# Patient Record
Sex: Female | Born: 1945 | Race: White | Hispanic: No | Smoking: Former smoker
Health system: Southern US, Community
[De-identification: ages and names within clinical notes are randomized; demographics above are authoritative.]

## PROBLEM LIST (undated history)

## (undated) DIAGNOSIS — E785 Hyperlipidemia, unspecified: Secondary | ICD-10-CM

## (undated) DIAGNOSIS — K589 Irritable bowel syndrome without diarrhea: Secondary | ICD-10-CM

## (undated) DIAGNOSIS — I10 Essential (primary) hypertension: Secondary | ICD-10-CM

## (undated) DIAGNOSIS — H269 Unspecified cataract: Secondary | ICD-10-CM

## (undated) DIAGNOSIS — M1990 Unspecified osteoarthritis, unspecified site: Secondary | ICD-10-CM

## (undated) HISTORY — DX: Hyperlipidemia, unspecified: E78.5

## (undated) HISTORY — PX: CATARACT EXTRACTION, BILATERAL: SHX1313

## (undated) HISTORY — PX: COLONOSCOPY: SHX174

## (undated) HISTORY — PX: CHOLECYSTECTOMY: SHX55

## (undated) HISTORY — DX: Unspecified cataract: H26.9

## (undated) HISTORY — DX: Essential (primary) hypertension: I10

## (undated) HISTORY — DX: Irritable bowel syndrome, unspecified: K58.9

## (undated) HISTORY — DX: Unspecified osteoarthritis, unspecified site: M19.90

## (undated) HISTORY — PX: APPENDECTOMY: SHX54

---

## 1999-02-15 ENCOUNTER — Other Ambulatory Visit: Admission: RE | Admit: 1999-02-15 | Discharge: 1999-02-15 | Payer: Self-pay | Admitting: Obstetrics and Gynecology

## 2000-10-19 ENCOUNTER — Other Ambulatory Visit: Admission: RE | Admit: 2000-10-19 | Discharge: 2000-10-19 | Payer: Self-pay | Admitting: Obstetrics and Gynecology

## 2002-01-07 ENCOUNTER — Other Ambulatory Visit: Admission: RE | Admit: 2002-01-07 | Discharge: 2002-01-07 | Payer: Self-pay | Admitting: Obstetrics and Gynecology

## 2002-03-18 ENCOUNTER — Encounter (INDEPENDENT_AMBULATORY_CARE_PROVIDER_SITE_OTHER): Payer: Self-pay

## 2002-03-18 ENCOUNTER — Ambulatory Visit (HOSPITAL_COMMUNITY): Admission: RE | Admit: 2002-03-18 | Discharge: 2002-03-18 | Payer: Self-pay | Admitting: Obstetrics and Gynecology

## 2003-02-23 ENCOUNTER — Other Ambulatory Visit: Admission: RE | Admit: 2003-02-23 | Discharge: 2003-02-23 | Payer: Self-pay | Admitting: Obstetrics and Gynecology

## 2004-06-13 ENCOUNTER — Other Ambulatory Visit: Admission: RE | Admit: 2004-06-13 | Discharge: 2004-06-13 | Payer: Self-pay | Admitting: Obstetrics and Gynecology

## 2004-09-04 ENCOUNTER — Ambulatory Visit (HOSPITAL_COMMUNITY): Admission: RE | Admit: 2004-09-04 | Discharge: 2004-09-04 | Payer: Self-pay | Admitting: *Deleted

## 2005-08-15 ENCOUNTER — Other Ambulatory Visit: Admission: RE | Admit: 2005-08-15 | Discharge: 2005-08-15 | Payer: Self-pay | Admitting: Obstetrics and Gynecology

## 2005-12-06 ENCOUNTER — Emergency Department (HOSPITAL_COMMUNITY): Admission: EM | Admit: 2005-12-06 | Discharge: 2005-12-06 | Payer: Self-pay | Admitting: Emergency Medicine

## 2005-12-10 ENCOUNTER — Encounter: Admission: RE | Admit: 2005-12-10 | Discharge: 2005-12-10 | Payer: Self-pay | Admitting: Internal Medicine

## 2006-08-18 ENCOUNTER — Encounter: Admission: RE | Admit: 2006-08-18 | Discharge: 2006-08-18 | Payer: Self-pay | Admitting: Obstetrics and Gynecology

## 2008-10-17 ENCOUNTER — Encounter: Admission: RE | Admit: 2008-10-17 | Discharge: 2008-10-17 | Payer: Self-pay | Admitting: Internal Medicine

## 2011-01-20 ENCOUNTER — Other Ambulatory Visit: Payer: Self-pay | Admitting: Internal Medicine

## 2011-01-20 DIAGNOSIS — Z1231 Encounter for screening mammogram for malignant neoplasm of breast: Secondary | ICD-10-CM

## 2011-01-27 ENCOUNTER — Ambulatory Visit
Admission: RE | Admit: 2011-01-27 | Discharge: 2011-01-27 | Disposition: A | Discharge status: Home / Self Care | Payer: 59 | Source: Ambulatory Visit | Attending: Internal Medicine | Admitting: Internal Medicine

## 2011-01-27 DIAGNOSIS — Z1231 Encounter for screening mammogram for malignant neoplasm of breast: Secondary | ICD-10-CM

## 2011-05-02 NOTE — H&P (Signed)
[facility]  Patient:    Connie Gilbert, Connie Gilbert Visit Number: [id number] MRN: [id number]          Service Type: Attending:  Guy Sandifer. Arleta Creek, M.D. Dictated by:   Guy Sandifer Arleta Creek, M.D. Adm. Date:  03/18/02                           History and Physical  CHIEF COMPLAINT:              Postmenopausal bleeding.  HISTORY OF PRESENT ILLNESS:   This patient is a 65 year old white female G1, P1 who is postmenopausal.  She discontinued Prempro this past July.  She subsequently had bleeding for about one week.  On ultrasound of February 18, 2002, the uterus measured 7.47 x 4.71 x 4.81 cm.  The endometrial stripe was 10.1 mm and appeared somewhat irregular and heterogenous.  There is a probable degenerating intramural fibroid measuring 1.4 cm in greatest dimension.  Ther was a 1 cm ruptured follicle on the left ovary.  The right ovary was normal. Sonohysterogram revealed a thickened irregular endometrial border with a question of a solid nodule, possibly consistent with a polyp measuring 1.3 cm. Hysteroscopy D&C has been discussed.  The patient is being admitted for this surgery.  PAST MEDICAL HISTORY:         1. Chronic hypertension.                               2. Irritable bowel syndrome.                               3. Abnormal lipids.                               4. History of cervical dysplasia.  PAST SURGICAL HISTORY:        Cholecystectomy.  FAMILY HISTORY:               Colon cancer in paternal aunt.  Lung cancer in maternal uncle.  Chronic hypertension in mother and father.  Heart disease in father and paternal grandfather.  Parkinsons in two paternal uncles. Osteoporosis.  OBSTETRIC HISTORY:            Vaginal delivery x 1.  MEDICATIONS:                  Prinivil, atenolol, Lipitor.  ALLERGIES:                    No known drug allergies.  SOCIAL HISTORY:               The patient consumes alcohol on a social basis. Denies tobacco or drug  abuse.  REVIEW OF SYSTEMS:            Negative except as above.  PHYSICAL EXAMINATION: VITAL SIGNS:                  Height 5 feet 1-1/2 inches, weight 128 pounds, blood pressure 140/78.  HEENT:                        Without thyromegaly.  LUNGS:  Clear to auscultation.  HEART:                        Regular rate and rhythm.  BACK:                         Without CVA tenderness.  BREASTS:                      Without masses, retractions or discharge.  PELVIC:                       Vulva, vaginal and cervix without lesions. Uterus is anteverted, normal size and nontender.  Adnexa nontender without masses.  RECTAL:                       Without masses.  EXTREMITIES:                  Within normal limits.  NEUROLOGICAL:                 Within normal limits.  ASSESSMENT:                   Postmenopausal bleeding.  PLAN:                         Hysteroscopy with resectoscope, dilatation and curettage. Dictated by:   Guy Sandifer Arleta Creek, M.D. Attending:  Guy Sandifer Arleta Creek, M.D. DD:  03/14/02 TD:  03/14/02 Job: [id number]

## 2011-05-02 NOTE — Op Note (Signed)
[name], Connie Gilbert                ACCOUNT NO.:  [id number]   MEDICAL RECORD NO.:  [id number]          PATIENT TYPE:  AMB   LOCATION:  ENDO                         FACILITY:  [facility]   PHYSICIAN:  Georgiana Spinner, M.D.    DATE OF BIRTH:  1946-06-11   DATE OF PROCEDURE:  09/04/2004  DATE OF DISCHARGE:                                 OPERATIVE REPORT   PROCEDURE:  Colonoscopy.   INDICATIONS FOR PROCEDURE:  Colon cancer screening.   ANESTHESIA:  Fentanyl 62.5 mcg, Versed 6 mg.   PROCEDURE:  With the patient mildly sedated in the left lateral decubitus  position, the Olympus videoscopic colonoscope was inserted in the rectum and  passed under direct vision to the cecum identified by the ileocecal valve  and appendiceal orifice, both of which were photographed.  From this point,  the colonoscope was slowly withdrawn taking circumferential views of the  entire colonic mucosa stopping in the rectum which appeared normal on direct  and showed small hemorrhoids on retroflex view.  The endoscope was  straightened and withdrawn.  The patient's vital signs and pulse oximeter  remained stable.  The patient tolerated the procedure well without apparent  complications.   FINDINGS:  Internal hemorrhoids, small, otherwise, unremarkable examination.   PLAN:  Follow up with me in five years or as needed.      GMO/MEDQ  D:  09/04/2004  T:  09/04/2004  Job:  [id number]

## 2011-05-02 NOTE — Op Note (Signed)
[facility]  Patient:    Connie Gilbert, Connie Gilbert Visit Number: [id number] MRN: [id number]          Service Type: DSU Location: [facility] Attending Physician:  Soledad Gerlach Dictated by:   Guy Sandifer Arleta Creek, M.D. Proc. Date: 03/18/02 Admit Date:  03/18/2002                             Operative Report  PREOPERATIVE DIAGNOSIS:       Postmenopausal bleeding.  POSTOPERATIVE DIAGNOSIS:      Endometrial mass.  OPERATION/PROCEDURE:          1. Hysteroscopy with resectoscope.                               2. Dilatation and curettage.  SURGEON                       Guy Sandifer. Arleta Creek, M.D.  ANESTHESIA:                   General with LMA.  ESTIMATED BLOOD LOSS:         Less than 50 cc.  INTAKE AND OUTPUT:            Sorbitol distending media, 90 cc deficit.  INDICATIONS/CONSENT:          The patient is a 65 year old white female, G1 P1, with postmenopausal bleeding.  Ultrasound with sonohysterogram was suggestive of an endometrial mass.  Hysteroscopy with resectoscope and D&C was discussed with the patient.  Potential risks and complications were discussed including, but not limited to, infection, bowel/bladder/ureteral damage, bleeding requiring transfusion of blood products with possible transfusion reaction, HIV, and hepatitis acquisition, DVT, PE, pneumonia, uterine perforation and hysteroscopy.  All questions were answered and consent is signed and on the chart.  FINDINGS:                     Fallopian tube ostia are normal.  No abnormal blood vessels.  In the mid posterior endometrial canal is an approximate 1 cm submucosal mass.  DESCRIPTION OF PROCEDURE:     The patient was taken to the operating room and placed in the dorsal supine position, with general anesthesia induced via LMA. She was then placed in the dorsal lithotomy position, where she was prepped, the bladder straight catheterized, and draped in sterile fashion.  A  bivalve speculum was placed in the vagina and the anterior cervical lip was injected with 1% Xylocaine and grasped with a single-tooth tenaculum.  Paracervical block was then placed at the 2, 4, 5, 7, 8, and 10 oclock positions with approximately 20 cc total of 1% Xylocaine plain.  The cervix was then gently progressively dilated to a 29 Pratt dilator.  A diagnostic hysteroscope was then introduced and advanced under direct visualization using Sorbitol distending medium.  The above findings were noted.  The diagnostic hysteroscope was withdrawn and the cervix was dilated to a 35 Pratt dilator. The resectoscope was then placed and advanced under direct visualization.  The above noted mass was resected with a single right angle wire loop without difficulty.  Good hemostasis was noted.  The hysteroscope was withdrawn and sharp curettage was carried out for very scant, if any, tissue.  The tenaculum was removed and good hemostasis was noted.  All counts were correct and  the patient was awakened and taken to the recovery room in stable condition. Dictated by:   Guy Sandifer Arleta Creek, M.D. Attending Physician:  Soledad Gerlach DD:  03/18/02 TD:  03/19/02 Job: [id number]

## 2011-06-05 ENCOUNTER — Other Ambulatory Visit: Payer: Self-pay | Admitting: Internal Medicine

## 2011-06-05 ENCOUNTER — Other Ambulatory Visit (HOSPITAL_COMMUNITY)
Admission: RE | Admit: 2011-06-05 | Discharge: 2011-06-05 | Disposition: A | Discharge status: Home / Self Care | Payer: 59 | Source: Ambulatory Visit | Attending: Internal Medicine | Admitting: Internal Medicine

## 2011-06-05 DIAGNOSIS — Z01419 Encounter for gynecological examination (general) (routine) without abnormal findings: Secondary | ICD-10-CM | POA: Insufficient documentation

## 2011-06-05 DIAGNOSIS — Z1159 Encounter for screening for other viral diseases: Secondary | ICD-10-CM | POA: Insufficient documentation

## 2012-07-21 DIAGNOSIS — E7800 Pure hypercholesterolemia, unspecified: Secondary | ICD-10-CM | POA: Diagnosis not present

## 2012-07-26 DIAGNOSIS — Z23 Encounter for immunization: Secondary | ICD-10-CM | POA: Diagnosis not present

## 2012-07-26 DIAGNOSIS — I10 Essential (primary) hypertension: Secondary | ICD-10-CM | POA: Diagnosis not present

## 2012-07-26 DIAGNOSIS — G43809 Other migraine, not intractable, without status migrainosus: Secondary | ICD-10-CM | POA: Diagnosis not present

## 2012-07-26 DIAGNOSIS — E7800 Pure hypercholesterolemia, unspecified: Secondary | ICD-10-CM | POA: Diagnosis not present

## 2012-09-01 DIAGNOSIS — L570 Actinic keratosis: Secondary | ICD-10-CM | POA: Diagnosis not present

## 2012-09-01 DIAGNOSIS — D235 Other benign neoplasm of skin of trunk: Secondary | ICD-10-CM | POA: Diagnosis not present

## 2012-10-22 DIAGNOSIS — L821 Other seborrheic keratosis: Secondary | ICD-10-CM | POA: Diagnosis not present

## 2012-10-22 DIAGNOSIS — L819 Disorder of pigmentation, unspecified: Secondary | ICD-10-CM | POA: Diagnosis not present

## 2012-10-22 DIAGNOSIS — Z419 Encounter for procedure for purposes other than remedying health state, unspecified: Secondary | ICD-10-CM | POA: Diagnosis not present

## 2013-01-20 DIAGNOSIS — I10 Essential (primary) hypertension: Secondary | ICD-10-CM | POA: Diagnosis not present

## 2013-01-20 DIAGNOSIS — E7800 Pure hypercholesterolemia, unspecified: Secondary | ICD-10-CM | POA: Diagnosis not present

## 2013-01-26 DIAGNOSIS — I10 Essential (primary) hypertension: Secondary | ICD-10-CM | POA: Diagnosis not present

## 2013-01-26 DIAGNOSIS — E7800 Pure hypercholesterolemia, unspecified: Secondary | ICD-10-CM | POA: Diagnosis not present

## 2013-01-26 DIAGNOSIS — Z7982 Long term (current) use of aspirin: Secondary | ICD-10-CM | POA: Diagnosis not present

## 2013-01-26 DIAGNOSIS — R079 Chest pain, unspecified: Secondary | ICD-10-CM | POA: Diagnosis not present

## 2013-04-12 ENCOUNTER — Other Ambulatory Visit: Payer: Self-pay

## 2013-04-12 DIAGNOSIS — Z1231 Encounter for screening mammogram for malignant neoplasm of breast: Secondary | ICD-10-CM

## 2013-04-19 ENCOUNTER — Ambulatory Visit: Payer: 59

## 2013-05-03 ENCOUNTER — Ambulatory Visit
Admission: RE | Admit: 2013-05-03 | Discharge: 2013-05-03 | Disposition: A | Discharge status: Home / Self Care | Payer: Medicare Other | Source: Ambulatory Visit

## 2013-05-03 DIAGNOSIS — Z1231 Encounter for screening mammogram for malignant neoplasm of breast: Secondary | ICD-10-CM | POA: Diagnosis not present

## 2013-07-11 ENCOUNTER — Telehealth: Payer: Self-pay

## 2013-07-11 ENCOUNTER — Ambulatory Visit (INDEPENDENT_AMBULATORY_CARE_PROVIDER_SITE_OTHER): Payer: Medicare Other | Admitting: Physician Assistant

## 2013-07-11 VITALS — BP 120/80 | HR 73 | Temp 98.0°F | Resp 16 | Ht 67.5 in | Wt 117.0 lb

## 2013-07-11 DIAGNOSIS — Z130 Encounter for screening for diseases of the blood and blood-forming organs and certain disorders involving the immune mechanism: Secondary | ICD-10-CM | POA: Diagnosis not present

## 2013-07-11 LAB — POCT CBC
Granulocyte percent: 72.8 %G (ref 37–80)
HCT, POC: 44.8 % (ref 37.7–47.9)
Hemoglobin: 14.8 g/dL (ref 12.2–16.2)
Lymph, poc: 1.3 (ref 0.6–3.4)
MCH, POC: 32.7 pg — AB (ref 27–31.2)
MCHC: 33 g/dL (ref 31.8–35.4)
MCV: 98.9 fL — AB (ref 80–97)
MID (cbc): 0.4 (ref 0–0.9)
MPV: 9.2 fL (ref 0–99.8)
POC Granulocyte: 4.5 (ref 2–6.9)
POC LYMPH PERCENT: 21.5 %L (ref 10–50)
POC MID %: 5.7 %M (ref 0–12)
Platelet Count, POC: 182 10*3/uL (ref 142–424)
RBC: 4.53 M/uL (ref 4.04–5.48)
RDW, POC: 12.6 %
WBC: 6.2 10*3/uL (ref 4.6–10.2)

## 2013-07-11 NOTE — Progress Notes (Signed)
  Subjective:    Patient ID: Connie Gilbert, female    DOB: May 28, 1946, 67 y.o.   MRN: [id number]  HPI This 67 y.o. female presents for CBC requested by Dr. Benna Dunks, in preparation for upcoming eyelid lift.   Review of Systems     Objective:   Physical Exam BP 120/80  Pulse 73  Temp(Src) 98 F (36.7 C) (Oral)  Resp 16  Ht 5' 7.5" (1.715 m)  Wt 117 lb (53.071 kg)  BMI 18.04 kg/m2  SpO2 99% WDWNWF, A&O x 3.  Results for orders placed in visit on 07/11/13  POCT CBC      Result Value Range   WBC 6.2  4.6 - 10.2 K/uL   Lymph, poc 1.3  0.6 - 3.4   POC LYMPH PERCENT 21.5  10 - 50 %L   MID (cbc) 0.4  0 - 0.9   POC MID % 5.7  0 - 12 %M   POC Granulocyte 4.5  2 - 6.9   Granulocyte percent 72.8  37 - 80 %G   RBC 4.53  4.04 - 5.48 M/uL   Hemoglobin 14.8  12.2 - 16.2 g/dL   HCT, POC 40.9  81.1 - 47.9 %   MCV 98.9 (*) 80 - 97 fL   MCH, POC 32.7 (*) 27 - 31.2 pg   MCHC 33.0  31.8 - 35.4 g/dL   RDW, POC 91.4     Platelet Count, POC 182  142 - 424 K/uL   MPV 9.2  0 - 99.8 fL        Assessment & Plan:  Screening for iron deficiency anemia - Plan: POCT CBC  Fernande Bras, PA-C Physician Assistant-Certified [facility]

## 2013-07-11 NOTE — Telephone Encounter (Signed)
Patients states that when she was here earlier that she knew that insurance probably want going to pay for CBC so that she will pay for it

## 2013-07-11 NOTE — Telephone Encounter (Signed)
Pt came back in the office and was stating that she was suppose to be signing some type of insurance form, she had received a call to come back and do so. I could not find any documentation in epic from the phone call and could not find chelle to ask about this form the patient was talking about Illene Silver tried to help with finding anything that was suppose to be signed but neither of [place] was able to find anything Call back number is [phone]

## 2013-07-11 NOTE — Telephone Encounter (Signed)
Please take care of this. Thanks  

## 2013-08-01 DIAGNOSIS — I10 Essential (primary) hypertension: Secondary | ICD-10-CM | POA: Diagnosis not present

## 2013-08-01 DIAGNOSIS — Z0000 Encounter for general adult medical examination without abnormal findings: Secondary | ICD-10-CM | POA: Diagnosis not present

## 2013-08-01 DIAGNOSIS — E7800 Pure hypercholesterolemia, unspecified: Secondary | ICD-10-CM | POA: Diagnosis not present

## 2013-08-11 DIAGNOSIS — Z1212 Encounter for screening for malignant neoplasm of rectum: Secondary | ICD-10-CM | POA: Diagnosis not present

## 2013-08-11 DIAGNOSIS — Z7982 Long term (current) use of aspirin: Secondary | ICD-10-CM | POA: Diagnosis not present

## 2013-08-11 DIAGNOSIS — E7800 Pure hypercholesterolemia, unspecified: Secondary | ICD-10-CM | POA: Diagnosis not present

## 2013-08-11 DIAGNOSIS — I10 Essential (primary) hypertension: Secondary | ICD-10-CM | POA: Diagnosis not present

## 2013-08-11 DIAGNOSIS — R079 Chest pain, unspecified: Secondary | ICD-10-CM | POA: Diagnosis not present

## 2013-09-02 DIAGNOSIS — L821 Other seborrheic keratosis: Secondary | ICD-10-CM | POA: Diagnosis not present

## 2013-09-02 DIAGNOSIS — D235 Other benign neoplasm of skin of trunk: Secondary | ICD-10-CM | POA: Diagnosis not present

## 2013-09-06 DIAGNOSIS — M899 Disorder of bone, unspecified: Secondary | ICD-10-CM | POA: Diagnosis not present

## 2013-10-21 DIAGNOSIS — M810 Age-related osteoporosis without current pathological fracture: Secondary | ICD-10-CM | POA: Diagnosis not present

## 2013-10-27 DIAGNOSIS — I10 Essential (primary) hypertension: Secondary | ICD-10-CM | POA: Diagnosis not present

## 2013-10-27 DIAGNOSIS — E7800 Pure hypercholesterolemia, unspecified: Secondary | ICD-10-CM | POA: Diagnosis not present

## 2013-10-27 DIAGNOSIS — M810 Age-related osteoporosis without current pathological fracture: Secondary | ICD-10-CM | POA: Diagnosis not present

## 2014-04-17 DIAGNOSIS — J329 Chronic sinusitis, unspecified: Secondary | ICD-10-CM | POA: Diagnosis not present

## 2014-04-28 DIAGNOSIS — Z961 Presence of intraocular lens: Secondary | ICD-10-CM | POA: Diagnosis not present

## 2014-04-28 DIAGNOSIS — H179 Unspecified corneal scar and opacity: Secondary | ICD-10-CM | POA: Diagnosis not present

## 2014-08-25 DIAGNOSIS — Z23 Encounter for immunization: Secondary | ICD-10-CM | POA: Diagnosis not present

## 2014-08-25 DIAGNOSIS — I10 Essential (primary) hypertension: Secondary | ICD-10-CM | POA: Diagnosis not present

## 2014-08-25 DIAGNOSIS — Z0000 Encounter for general adult medical examination without abnormal findings: Secondary | ICD-10-CM | POA: Diagnosis not present

## 2014-08-25 DIAGNOSIS — M810 Age-related osteoporosis without current pathological fracture: Secondary | ICD-10-CM | POA: Diagnosis not present

## 2014-08-25 DIAGNOSIS — E7800 Pure hypercholesterolemia, unspecified: Secondary | ICD-10-CM | POA: Diagnosis not present

## 2014-09-05 DIAGNOSIS — L821 Other seborrheic keratosis: Secondary | ICD-10-CM | POA: Diagnosis not present

## 2014-09-05 DIAGNOSIS — L820 Inflamed seborrheic keratosis: Secondary | ICD-10-CM | POA: Diagnosis not present

## 2014-09-05 DIAGNOSIS — D485 Neoplasm of uncertain behavior of skin: Secondary | ICD-10-CM | POA: Diagnosis not present

## 2014-09-05 DIAGNOSIS — L819 Disorder of pigmentation, unspecified: Secondary | ICD-10-CM | POA: Diagnosis not present

## 2014-09-05 DIAGNOSIS — D235 Other benign neoplasm of skin of trunk: Secondary | ICD-10-CM | POA: Diagnosis not present

## 2014-09-07 ENCOUNTER — Other Ambulatory Visit: Payer: Self-pay | Admitting: Internal Medicine

## 2014-09-07 ENCOUNTER — Other Ambulatory Visit (HOSPITAL_COMMUNITY)
Admission: RE | Admit: 2014-09-07 | Discharge: 2014-09-07 | Disposition: A | Discharge status: Home / Self Care | Payer: Medicare Other | Source: Ambulatory Visit | Attending: Internal Medicine | Admitting: Internal Medicine

## 2014-09-07 DIAGNOSIS — Z124 Encounter for screening for malignant neoplasm of cervix: Secondary | ICD-10-CM | POA: Insufficient documentation

## 2014-09-07 DIAGNOSIS — Z9089 Acquired absence of other organs: Secondary | ICD-10-CM | POA: Diagnosis not present

## 2014-09-07 DIAGNOSIS — Z1151 Encounter for screening for human papillomavirus (HPV): Secondary | ICD-10-CM | POA: Insufficient documentation

## 2014-09-07 DIAGNOSIS — I10 Essential (primary) hypertension: Secondary | ICD-10-CM | POA: Diagnosis not present

## 2014-09-07 DIAGNOSIS — M810 Age-related osteoporosis without current pathological fracture: Secondary | ICD-10-CM | POA: Diagnosis not present

## 2014-09-07 DIAGNOSIS — E7800 Pure hypercholesterolemia, unspecified: Secondary | ICD-10-CM | POA: Diagnosis not present

## 2014-09-07 DIAGNOSIS — Z9889 Other specified postprocedural states: Secondary | ICD-10-CM | POA: Diagnosis not present

## 2014-09-12 LAB — CYTOLOGY - PAP

## 2014-10-19 ENCOUNTER — Encounter: Payer: Self-pay | Admitting: Internal Medicine

## 2014-12-11 ENCOUNTER — Ambulatory Visit (AMBULATORY_SURGERY_CENTER): Payer: Self-pay | Admitting: *Deleted

## 2014-12-11 VITALS — Ht 61.0 in | Wt 121.6 lb

## 2014-12-11 DIAGNOSIS — Z1211 Encounter for screening for malignant neoplasm of colon: Secondary | ICD-10-CM

## 2014-12-11 NOTE — Progress Notes (Signed)
No egg or soy allergy. ewm  No issues with past sedation. ewm  No diet pills. No blood thinners. ewm  No home 02 use, no cpap. ewm  Pt declined emmi video. ewm

## 2014-12-28 ENCOUNTER — Encounter: Payer: Self-pay | Admitting: Internal Medicine

## 2014-12-28 ENCOUNTER — Ambulatory Visit (AMBULATORY_SURGERY_CENTER): Payer: Medicare Other | Admitting: Internal Medicine

## 2014-12-28 VITALS — BP 151/85 | HR 70 | Temp 96.3°F | Resp 20 | Ht 61.0 in | Wt 121.0 lb

## 2014-12-28 DIAGNOSIS — Z1211 Encounter for screening for malignant neoplasm of colon: Secondary | ICD-10-CM | POA: Diagnosis not present

## 2014-12-28 DIAGNOSIS — I10 Essential (primary) hypertension: Secondary | ICD-10-CM | POA: Diagnosis not present

## 2014-12-28 MED ORDER — SODIUM CHLORIDE 0.9 % IV SOLN: 500.0000 mL | INTRAVENOUS | Status: DC

## 2014-12-28 NOTE — Progress Notes (Signed)
A/ox3 pleased with MAC, report to Karen RN 

## 2014-12-28 NOTE — Patient Instructions (Addendum)
Colonoscopy was normal! No polyps or cancer seen.  You may consider repeating a colonoscopy in 10 years but at 89 it may not be necessary or worth it to pursue. You can sort that out with your primary care doctor.  I appreciate the opportunity to care for you. Gatha Mayer, MD, FACG  YOU HAD AN ENDOSCOPIC PROCEDURE TODAY AT [facility]: Refer to the procedure report that was given to you for any specific questions about what was found during the examination.  If the procedure report does not answer your questions, please call your gastroenterologist to clarify.  If you requested that your care partner not be given the details of your procedure findings, then the procedure report has been included in a sealed envelope for you to review at your convenience later.  YOU SHOULD EXPECT: Some feelings of bloating in the abdomen. Passage of more gas than usual.  Walking can help get rid of the air that was put into your GI tract during the procedure and reduce the bloating. If you had a lower endoscopy (such as a colonoscopy or flexible sigmoidoscopy) you may notice spotting of blood in your stool or on the toilet paper. If you underwent a bowel prep for your procedure, then you may not have a normal bowel movement for a few days.  DIET: Your first meal following the procedure should be a light meal and then it is ok to progress to your normal diet.  A half-sandwich or bowl of soup is an example of a good first meal.  Heavy or fried foods are harder to digest and may make you feel nauseous or bloated.  Likewise meals heavy in dairy and vegetables can cause extra gas to form and this can also increase the bloating.  Drink plenty of fluids but you should avoid alcoholic beverages for 24 hours.  ACTIVITY: Your care partner should take you home directly after the procedure.  You should plan to take it easy, moving slowly for the rest of the day.  You can resume normal activity the day  after the procedure however you should NOT DRIVE or use heavy machinery for 24 hours (because of the sedation medicines used during the test).    SYMPTOMS TO REPORT IMMEDIATELY: A gastroenterologist can be reached at any hour.  During normal business hours, 8:30 AM to 5:00 PM Monday through Friday, call [phone].  After hours and on weekends, please call the GI answering service at [phone] who will take a message and have the physician on call contact you.   Following lower endoscopy (colonoscopy or flexible sigmoidoscopy):  Excessive amounts of blood in the stool  Significant tenderness or worsening of abdominal pains  Swelling of the abdomen that is new, acute  Fever of 100F or higher   FOLLOW UP: If any biopsies were taken you will be contacted by phone or by letter within the next 1-3 weeks.  Call your gastroenterologist if you have not heard about the biopsies in 3 weeks.  Our staff will call the home number listed on your records the next business day following your procedure to check on you and address any questions or concerns that you may have at that time regarding the information given to you following your procedure. This is a courtesy call and so if there is no answer at the home number and we have not heard from you through the emergency physician on call, we will assume that you have  returned to your regular daily activities without incident.  SIGNATURES/CONFIDENTIALITY: You and/or your care partner have signed paperwork which will be entered into your electronic medical record.  These signatures attest to the fact that that the information above on your After Visit Summary has been reviewed and is understood.  Full responsibility of the confidentiality of this discharge information lies with you and/or your care-partner.

## 2014-12-28 NOTE — Op Note (Signed)
[place]  [address] [place], [id number]   COLONOSCOPY PROCEDURE REPORT  PATIENT: Connie Gilbert, Connie Gilbert  MR#: [id number] BIRTHDATE: 28-Mar-1946 , 75  yrs. old GENDER: female ENDOSCOPIST: Gatha Mayer, MD, [facility] PROCEDURE DATE:  12/28/2014 PROCEDURE:   Colonoscopy, screening First Screening Colonoscopy - Avg.  risk and is 50 yrs.  old or older - No.  Prior Negative Screening - Now for repeat screening. 10 or more years since last screening  History of Adenoma - Now for follow-up colonoscopy & has been > or = to 3 yrs.  N/A  Polyps Removed Today? No.  Polyps Removed Today? No.  Recommend repeat exam, <10 yrs? Polyps Removed Today? No.  Recommend repeat exam, <10 yrs? No. ASA CLASS:   Class II INDICATIONS:average risk for colorectal cancer. MEDICATIONS: Propofol 160 mg IV and Monitored anesthesia care  DESCRIPTION OF PROCEDURE:   After the risks benefits and alternatives of the procedure were thoroughly explained, informed consent was obtained.  The digital rectal exam revealed no abnormalities of the rectum.   The [id number]  endoscope was introduced through the anus and advanced to the cecum, which was identified by both the appendix and ileocecal valve. No adverse events experienced.   The quality of the prep was excellent, using MiraLax  The instrument was then slowly withdrawn as the colon was fully examined.      COLON FINDINGS: A normal appearing cecum, ileocecal valve, and appendiceal orifice were identified.  The ascending, transverse, descending, sigmoid colon, and rectum appeared unremarkable. Retroflexed views revealed no abnormalities. The time to cecum=1 minutes 55 seconds.  Withdrawal time=9 minutes 58 seconds.  The scope was withdrawn and the procedure completed. COMPLICATIONS: There were no immediate complications.  ENDOSCOPIC IMPRESSION: Normal colonoscopy - excellent prep  RECOMMENDATIONS: Consider repeat screening in 10 yrs at  age 20 - decide with PCP - no recall  eSigned:  Gatha Mayer, MD, [facility] 12/28/2014 10:03 AM   cc: The Patient and Lynnell Chad. Shelia Media, MD

## 2014-12-29 ENCOUNTER — Telehealth: Payer: Self-pay

## 2014-12-29 NOTE — Telephone Encounter (Signed)
  Follow up Call-  Call back number 12/28/2014  Post procedure Call Back phone  # [phone] hm  Permission to leave phone message Yes     Patient questions:  Do you have a fever, pain , or abdominal swelling? No. Pain Score  0 *  Have you tolerated food without any problems? Yes.    Have you been able to return to your normal activities? Yes.    Do you have any questions about your discharge instructions: Diet   No. Medications  No. Follow up visit  No.  Do you have questions or concerns about your Care? No.  Actions: * If pain score is 4 or above: No action needed, pain <4.

## 2015-04-30 DIAGNOSIS — Z961 Presence of intraocular lens: Secondary | ICD-10-CM | POA: Diagnosis not present

## 2015-04-30 DIAGNOSIS — H1789 Other corneal scars and opacities: Secondary | ICD-10-CM | POA: Diagnosis not present

## 2015-09-11 DIAGNOSIS — L814 Other melanin hyperpigmentation: Secondary | ICD-10-CM | POA: Diagnosis not present

## 2015-09-11 DIAGNOSIS — L820 Inflamed seborrheic keratosis: Secondary | ICD-10-CM | POA: Diagnosis not present

## 2015-09-11 DIAGNOSIS — D225 Melanocytic nevi of trunk: Secondary | ICD-10-CM | POA: Diagnosis not present

## 2015-09-18 DIAGNOSIS — M810 Age-related osteoporosis without current pathological fracture: Secondary | ICD-10-CM | POA: Diagnosis not present

## 2015-09-18 DIAGNOSIS — I10 Essential (primary) hypertension: Secondary | ICD-10-CM | POA: Diagnosis not present

## 2015-09-18 DIAGNOSIS — E7800 Pure hypercholesterolemia, unspecified: Secondary | ICD-10-CM | POA: Diagnosis not present

## 2015-09-24 DIAGNOSIS — T508X5A Adverse effect of diagnostic agents, initial encounter: Secondary | ICD-10-CM | POA: Diagnosis not present

## 2015-09-24 DIAGNOSIS — Z1212 Encounter for screening for malignant neoplasm of rectum: Secondary | ICD-10-CM | POA: Diagnosis not present

## 2015-09-24 DIAGNOSIS — E7800 Pure hypercholesterolemia, unspecified: Secondary | ICD-10-CM | POA: Diagnosis not present

## 2015-09-24 DIAGNOSIS — K649 Unspecified hemorrhoids: Secondary | ICD-10-CM | POA: Diagnosis not present

## 2015-09-24 DIAGNOSIS — Z9049 Acquired absence of other specified parts of digestive tract: Secondary | ICD-10-CM | POA: Diagnosis not present

## 2015-10-08 DIAGNOSIS — M810 Age-related osteoporosis without current pathological fracture: Secondary | ICD-10-CM | POA: Diagnosis not present

## 2015-10-08 DIAGNOSIS — I10 Essential (primary) hypertension: Secondary | ICD-10-CM | POA: Diagnosis not present

## 2015-10-22 DIAGNOSIS — M25551 Pain in right hip: Secondary | ICD-10-CM | POA: Diagnosis not present

## 2015-11-14 DIAGNOSIS — I10 Essential (primary) hypertension: Secondary | ICD-10-CM | POA: Diagnosis not present

## 2016-03-31 ENCOUNTER — Other Ambulatory Visit: Payer: Self-pay

## 2016-03-31 DIAGNOSIS — Z1231 Encounter for screening mammogram for malignant neoplasm of breast: Secondary | ICD-10-CM

## 2016-04-11 ENCOUNTER — Ambulatory Visit
Admission: RE | Admit: 2016-04-11 | Discharge: 2016-04-11 | Disposition: A | Discharge status: Home / Self Care | Payer: Medicare Other | Source: Ambulatory Visit

## 2016-04-11 DIAGNOSIS — Z1231 Encounter for screening mammogram for malignant neoplasm of breast: Secondary | ICD-10-CM | POA: Diagnosis not present

## 2016-04-28 DIAGNOSIS — Z961 Presence of intraocular lens: Secondary | ICD-10-CM | POA: Diagnosis not present

## 2016-04-28 DIAGNOSIS — H1789 Other corneal scars and opacities: Secondary | ICD-10-CM | POA: Diagnosis not present

## 2016-08-16 DIAGNOSIS — H60391 Other infective otitis externa, right ear: Secondary | ICD-10-CM | POA: Diagnosis not present

## 2016-09-15 DIAGNOSIS — L814 Other melanin hyperpigmentation: Secondary | ICD-10-CM | POA: Diagnosis not present

## 2016-09-15 DIAGNOSIS — D225 Melanocytic nevi of trunk: Secondary | ICD-10-CM | POA: Diagnosis not present

## 2016-09-15 DIAGNOSIS — L821 Other seborrheic keratosis: Secondary | ICD-10-CM | POA: Diagnosis not present

## 2016-09-15 DIAGNOSIS — L820 Inflamed seborrheic keratosis: Secondary | ICD-10-CM | POA: Diagnosis not present

## 2016-09-23 DIAGNOSIS — Z0000 Encounter for general adult medical examination without abnormal findings: Secondary | ICD-10-CM | POA: Diagnosis not present

## 2016-09-23 DIAGNOSIS — K649 Unspecified hemorrhoids: Secondary | ICD-10-CM | POA: Diagnosis not present

## 2016-09-23 DIAGNOSIS — E559 Vitamin D deficiency, unspecified: Secondary | ICD-10-CM | POA: Diagnosis not present

## 2016-09-23 DIAGNOSIS — Z23 Encounter for immunization: Secondary | ICD-10-CM | POA: Diagnosis not present

## 2016-09-23 DIAGNOSIS — Z7982 Long term (current) use of aspirin: Secondary | ICD-10-CM | POA: Diagnosis not present

## 2016-09-23 DIAGNOSIS — I10 Essential (primary) hypertension: Secondary | ICD-10-CM | POA: Diagnosis not present

## 2016-09-29 DIAGNOSIS — D72819 Decreased white blood cell count, unspecified: Secondary | ICD-10-CM | POA: Diagnosis not present

## 2016-09-29 DIAGNOSIS — G43109 Migraine with aura, not intractable, without status migrainosus: Secondary | ICD-10-CM | POA: Diagnosis not present

## 2016-09-29 DIAGNOSIS — Z7982 Long term (current) use of aspirin: Secondary | ICD-10-CM | POA: Diagnosis not present

## 2016-09-29 DIAGNOSIS — I10 Essential (primary) hypertension: Secondary | ICD-10-CM | POA: Diagnosis not present

## 2016-09-29 DIAGNOSIS — Z1212 Encounter for screening for malignant neoplasm of rectum: Secondary | ICD-10-CM | POA: Diagnosis not present

## 2016-10-29 DIAGNOSIS — H838X1 Other specified diseases of right inner ear: Secondary | ICD-10-CM | POA: Diagnosis not present

## 2016-10-29 DIAGNOSIS — H608X1 Other otitis externa, right ear: Secondary | ICD-10-CM | POA: Diagnosis not present

## 2016-10-29 DIAGNOSIS — H9311 Tinnitus, right ear: Secondary | ICD-10-CM | POA: Diagnosis not present

## 2017-02-23 DIAGNOSIS — J329 Chronic sinusitis, unspecified: Secondary | ICD-10-CM | POA: Diagnosis not present

## 2017-04-27 DIAGNOSIS — H1789 Other corneal scars and opacities: Secondary | ICD-10-CM | POA: Diagnosis not present

## 2017-04-27 DIAGNOSIS — Z961 Presence of intraocular lens: Secondary | ICD-10-CM | POA: Diagnosis not present

## 2017-06-16 DIAGNOSIS — R197 Diarrhea, unspecified: Secondary | ICD-10-CM | POA: Diagnosis not present

## 2017-06-18 DIAGNOSIS — R197 Diarrhea, unspecified: Secondary | ICD-10-CM | POA: Diagnosis not present

## 2017-09-17 DIAGNOSIS — M810 Age-related osteoporosis without current pathological fracture: Secondary | ICD-10-CM | POA: Diagnosis not present

## 2017-09-23 DIAGNOSIS — L820 Inflamed seborrheic keratosis: Secondary | ICD-10-CM | POA: Diagnosis not present

## 2017-09-23 DIAGNOSIS — L218 Other seborrheic dermatitis: Secondary | ICD-10-CM | POA: Diagnosis not present

## 2017-09-23 DIAGNOSIS — D225 Melanocytic nevi of trunk: Secondary | ICD-10-CM | POA: Diagnosis not present

## 2017-09-23 DIAGNOSIS — L821 Other seborrheic keratosis: Secondary | ICD-10-CM | POA: Diagnosis not present

## 2017-10-06 DIAGNOSIS — M810 Age-related osteoporosis without current pathological fracture: Secondary | ICD-10-CM | POA: Diagnosis not present

## 2017-10-06 DIAGNOSIS — Z0000 Encounter for general adult medical examination without abnormal findings: Secondary | ICD-10-CM | POA: Diagnosis not present

## 2017-10-06 DIAGNOSIS — Z7982 Long term (current) use of aspirin: Secondary | ICD-10-CM | POA: Diagnosis not present

## 2017-10-06 DIAGNOSIS — E7800 Pure hypercholesterolemia, unspecified: Secondary | ICD-10-CM | POA: Diagnosis not present

## 2017-10-06 DIAGNOSIS — Z23 Encounter for immunization: Secondary | ICD-10-CM | POA: Diagnosis not present

## 2017-10-06 DIAGNOSIS — I10 Essential (primary) hypertension: Secondary | ICD-10-CM | POA: Diagnosis not present

## 2017-10-08 DIAGNOSIS — G43109 Migraine with aura, not intractable, without status migrainosus: Secondary | ICD-10-CM | POA: Diagnosis not present

## 2017-10-08 DIAGNOSIS — M810 Age-related osteoporosis without current pathological fracture: Secondary | ICD-10-CM | POA: Diagnosis not present

## 2017-10-08 DIAGNOSIS — Z7982 Long term (current) use of aspirin: Secondary | ICD-10-CM | POA: Diagnosis not present

## 2017-10-08 DIAGNOSIS — K589 Irritable bowel syndrome without diarrhea: Secondary | ICD-10-CM | POA: Diagnosis not present

## 2017-10-08 DIAGNOSIS — Z8659 Personal history of other mental and behavioral disorders: Secondary | ICD-10-CM | POA: Diagnosis not present

## 2017-10-08 DIAGNOSIS — T508X5A Adverse effect of diagnostic agents, initial encounter: Secondary | ICD-10-CM | POA: Diagnosis not present

## 2017-10-08 DIAGNOSIS — I10 Essential (primary) hypertension: Secondary | ICD-10-CM | POA: Diagnosis not present

## 2017-10-08 DIAGNOSIS — K649 Unspecified hemorrhoids: Secondary | ICD-10-CM | POA: Diagnosis not present

## 2017-10-08 DIAGNOSIS — E7800 Pure hypercholesterolemia, unspecified: Secondary | ICD-10-CM | POA: Diagnosis not present

## 2017-10-08 DIAGNOSIS — D72819 Decreased white blood cell count, unspecified: Secondary | ICD-10-CM | POA: Diagnosis not present

## 2017-10-08 DIAGNOSIS — Z780 Asymptomatic menopausal state: Secondary | ICD-10-CM | POA: Diagnosis not present

## 2017-11-17 DIAGNOSIS — H5712 Ocular pain, left eye: Secondary | ICD-10-CM | POA: Diagnosis not present

## 2017-12-25 DIAGNOSIS — H43812 Vitreous degeneration, left eye: Secondary | ICD-10-CM | POA: Diagnosis not present

## 2017-12-25 DIAGNOSIS — H179 Unspecified corneal scar and opacity: Secondary | ICD-10-CM | POA: Diagnosis not present

## 2017-12-25 DIAGNOSIS — H1852 Epithelial (juvenile) corneal dystrophy: Secondary | ICD-10-CM | POA: Diagnosis not present

## 2018-03-08 DIAGNOSIS — M810 Age-related osteoporosis without current pathological fracture: Secondary | ICD-10-CM | POA: Diagnosis not present

## 2018-03-08 DIAGNOSIS — I10 Essential (primary) hypertension: Secondary | ICD-10-CM | POA: Diagnosis not present

## 2018-04-07 DIAGNOSIS — M810 Age-related osteoporosis without current pathological fracture: Secondary | ICD-10-CM | POA: Diagnosis not present

## 2018-04-27 DIAGNOSIS — H179 Unspecified corneal scar and opacity: Secondary | ICD-10-CM | POA: Diagnosis not present

## 2018-04-27 DIAGNOSIS — H43812 Vitreous degeneration, left eye: Secondary | ICD-10-CM | POA: Diagnosis not present

## 2018-04-27 DIAGNOSIS — H1852 Epithelial (juvenile) corneal dystrophy: Secondary | ICD-10-CM | POA: Diagnosis not present

## 2018-04-27 DIAGNOSIS — Z961 Presence of intraocular lens: Secondary | ICD-10-CM | POA: Diagnosis not present

## 2018-10-06 DIAGNOSIS — I10 Essential (primary) hypertension: Secondary | ICD-10-CM | POA: Diagnosis not present

## 2018-10-06 DIAGNOSIS — E7800 Pure hypercholesterolemia, unspecified: Secondary | ICD-10-CM | POA: Diagnosis not present

## 2018-10-06 DIAGNOSIS — Z7982 Long term (current) use of aspirin: Secondary | ICD-10-CM | POA: Diagnosis not present

## 2018-10-11 DIAGNOSIS — K649 Unspecified hemorrhoids: Secondary | ICD-10-CM | POA: Diagnosis not present

## 2018-10-11 DIAGNOSIS — E7800 Pure hypercholesterolemia, unspecified: Secondary | ICD-10-CM | POA: Diagnosis not present

## 2018-10-11 DIAGNOSIS — M810 Age-related osteoporosis without current pathological fracture: Secondary | ICD-10-CM | POA: Diagnosis not present

## 2018-10-11 DIAGNOSIS — K589 Irritable bowel syndrome without diarrhea: Secondary | ICD-10-CM | POA: Diagnosis not present

## 2018-10-11 DIAGNOSIS — I10 Essential (primary) hypertension: Secondary | ICD-10-CM | POA: Diagnosis not present

## 2018-10-11 DIAGNOSIS — Z0000 Encounter for general adult medical examination without abnormal findings: Secondary | ICD-10-CM | POA: Diagnosis not present

## 2018-10-11 DIAGNOSIS — Z780 Asymptomatic menopausal state: Secondary | ICD-10-CM | POA: Diagnosis not present

## 2018-10-11 DIAGNOSIS — G43109 Migraine with aura, not intractable, without status migrainosus: Secondary | ICD-10-CM | POA: Diagnosis not present

## 2018-10-11 DIAGNOSIS — H93A1 Pulsatile tinnitus, right ear: Secondary | ICD-10-CM | POA: Diagnosis not present

## 2018-10-11 DIAGNOSIS — Z7982 Long term (current) use of aspirin: Secondary | ICD-10-CM | POA: Diagnosis not present

## 2018-10-27 ENCOUNTER — Other Ambulatory Visit: Payer: Self-pay | Admitting: Internal Medicine

## 2018-10-27 DIAGNOSIS — Z1231 Encounter for screening mammogram for malignant neoplasm of breast: Secondary | ICD-10-CM

## 2018-11-08 DIAGNOSIS — I10 Essential (primary) hypertension: Secondary | ICD-10-CM | POA: Diagnosis not present

## 2018-11-18 DIAGNOSIS — I10 Essential (primary) hypertension: Secondary | ICD-10-CM | POA: Diagnosis not present

## 2018-11-18 DIAGNOSIS — Z87891 Personal history of nicotine dependence: Secondary | ICD-10-CM | POA: Diagnosis not present

## 2018-11-22 DIAGNOSIS — E7800 Pure hypercholesterolemia, unspecified: Secondary | ICD-10-CM | POA: Diagnosis not present

## 2018-11-22 DIAGNOSIS — I10 Essential (primary) hypertension: Secondary | ICD-10-CM | POA: Diagnosis not present

## 2018-11-25 DIAGNOSIS — I10 Essential (primary) hypertension: Secondary | ICD-10-CM | POA: Diagnosis not present

## 2018-11-30 DIAGNOSIS — L814 Other melanin hyperpigmentation: Secondary | ICD-10-CM | POA: Diagnosis not present

## 2018-11-30 DIAGNOSIS — D225 Melanocytic nevi of trunk: Secondary | ICD-10-CM | POA: Diagnosis not present

## 2018-11-30 DIAGNOSIS — L821 Other seborrheic keratosis: Secondary | ICD-10-CM | POA: Diagnosis not present

## 2018-12-20 ENCOUNTER — Ambulatory Visit
Admission: RE | Admit: 2018-12-20 | Discharge: 2018-12-20 | Disposition: A | Discharge status: Home / Self Care | Payer: Medicare Other | Source: Ambulatory Visit | Attending: Internal Medicine | Admitting: Internal Medicine

## 2018-12-20 DIAGNOSIS — E7800 Pure hypercholesterolemia, unspecified: Secondary | ICD-10-CM | POA: Diagnosis not present

## 2018-12-20 DIAGNOSIS — Z1231 Encounter for screening mammogram for malignant neoplasm of breast: Secondary | ICD-10-CM

## 2018-12-20 DIAGNOSIS — I10 Essential (primary) hypertension: Secondary | ICD-10-CM | POA: Diagnosis not present

## 2019-01-10 ENCOUNTER — Ambulatory Visit: Payer: Medicare Other

## 2019-01-20 DIAGNOSIS — E7800 Pure hypercholesterolemia, unspecified: Secondary | ICD-10-CM | POA: Diagnosis not present

## 2019-01-20 DIAGNOSIS — I10 Essential (primary) hypertension: Secondary | ICD-10-CM | POA: Diagnosis not present

## 2019-04-25 DIAGNOSIS — E7800 Pure hypercholesterolemia, unspecified: Secondary | ICD-10-CM | POA: Diagnosis not present

## 2019-04-25 DIAGNOSIS — I10 Essential (primary) hypertension: Secondary | ICD-10-CM | POA: Diagnosis not present

## 2019-04-28 DIAGNOSIS — E7800 Pure hypercholesterolemia, unspecified: Secondary | ICD-10-CM | POA: Diagnosis not present

## 2019-04-28 DIAGNOSIS — I10 Essential (primary) hypertension: Secondary | ICD-10-CM | POA: Diagnosis not present

## 2019-04-28 DIAGNOSIS — M25511 Pain in right shoulder: Secondary | ICD-10-CM | POA: Diagnosis not present

## 2019-05-03 DIAGNOSIS — M7541 Impingement syndrome of right shoulder: Secondary | ICD-10-CM | POA: Diagnosis not present

## 2019-05-03 DIAGNOSIS — M25511 Pain in right shoulder: Secondary | ICD-10-CM | POA: Diagnosis not present

## 2019-05-27 DIAGNOSIS — H179 Unspecified corneal scar and opacity: Secondary | ICD-10-CM | POA: Diagnosis not present

## 2019-05-27 DIAGNOSIS — H02889 Meibomian gland dysfunction of unspecified eye, unspecified eyelid: Secondary | ICD-10-CM | POA: Diagnosis not present

## 2019-05-27 DIAGNOSIS — H1852 Epithelial (juvenile) corneal dystrophy: Secondary | ICD-10-CM | POA: Diagnosis not present

## 2019-05-27 DIAGNOSIS — Z961 Presence of intraocular lens: Secondary | ICD-10-CM | POA: Diagnosis not present

## 2019-07-15 ENCOUNTER — Other Ambulatory Visit: Payer: Self-pay

## 2019-10-13 DIAGNOSIS — Z1159 Encounter for screening for other viral diseases: Secondary | ICD-10-CM | POA: Diagnosis not present

## 2019-10-13 DIAGNOSIS — E7800 Pure hypercholesterolemia, unspecified: Secondary | ICD-10-CM | POA: Diagnosis not present

## 2019-10-13 DIAGNOSIS — Z7982 Long term (current) use of aspirin: Secondary | ICD-10-CM | POA: Diagnosis not present

## 2019-10-13 DIAGNOSIS — I10 Essential (primary) hypertension: Secondary | ICD-10-CM | POA: Diagnosis not present

## 2019-10-17 DIAGNOSIS — K589 Irritable bowel syndrome without diarrhea: Secondary | ICD-10-CM | POA: Diagnosis not present

## 2019-10-17 DIAGNOSIS — E7800 Pure hypercholesterolemia, unspecified: Secondary | ICD-10-CM | POA: Diagnosis not present

## 2019-10-17 DIAGNOSIS — Z7982 Long term (current) use of aspirin: Secondary | ICD-10-CM | POA: Diagnosis not present

## 2019-10-17 DIAGNOSIS — I10 Essential (primary) hypertension: Secondary | ICD-10-CM | POA: Diagnosis not present

## 2019-10-17 DIAGNOSIS — M810 Age-related osteoporosis without current pathological fracture: Secondary | ICD-10-CM | POA: Diagnosis not present

## 2019-10-17 DIAGNOSIS — Z8659 Personal history of other mental and behavioral disorders: Secondary | ICD-10-CM | POA: Diagnosis not present

## 2019-10-17 DIAGNOSIS — Z0000 Encounter for general adult medical examination without abnormal findings: Secondary | ICD-10-CM | POA: Diagnosis not present

## 2019-10-17 DIAGNOSIS — T508X5A Adverse effect of diagnostic agents, initial encounter: Secondary | ICD-10-CM | POA: Diagnosis not present

## 2019-12-20 DIAGNOSIS — M810 Age-related osteoporosis without current pathological fracture: Secondary | ICD-10-CM | POA: Diagnosis not present

## 2020-02-07 IMAGING — MG DIGITAL SCREENING BILATERAL MAMMOGRAM WITH TOMO AND CAD
8 series · 9 of 24 positions shown · non-contrast
Comparison: Previous exam(s).

CLINICAL DATA: Screening.

EXAM:
DIGITAL SCREENING BILATERAL MAMMOGRAM WITH TOMO AND CAD

[L MLO synth-2D]
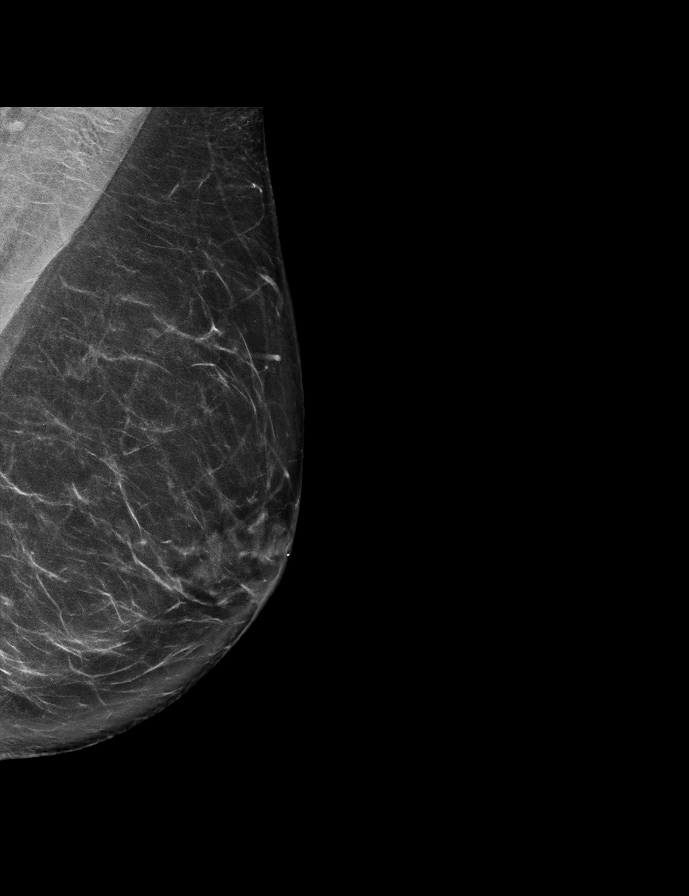

[R MLO synth-2D]
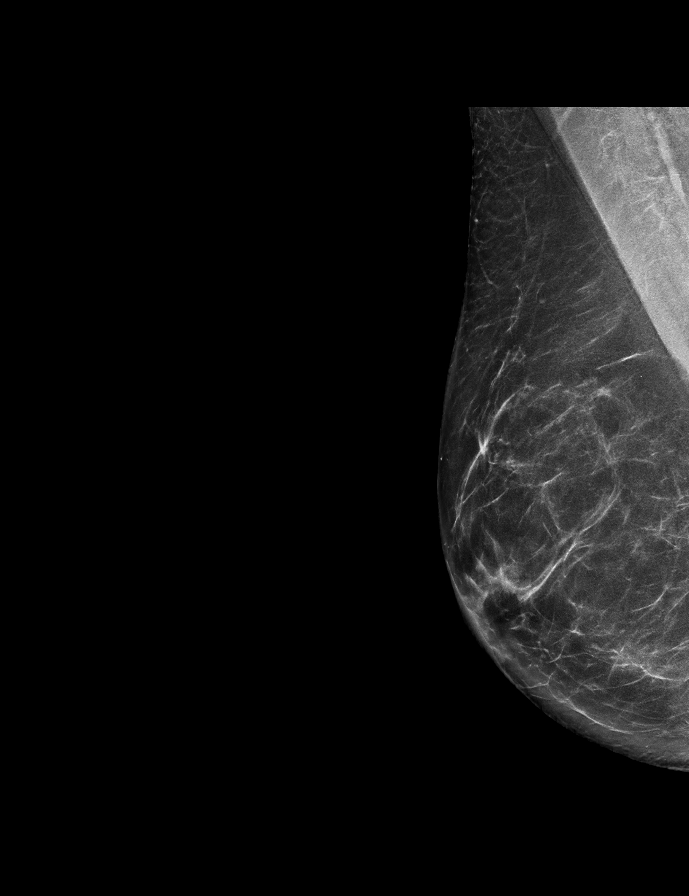

[L CC synth-2D]
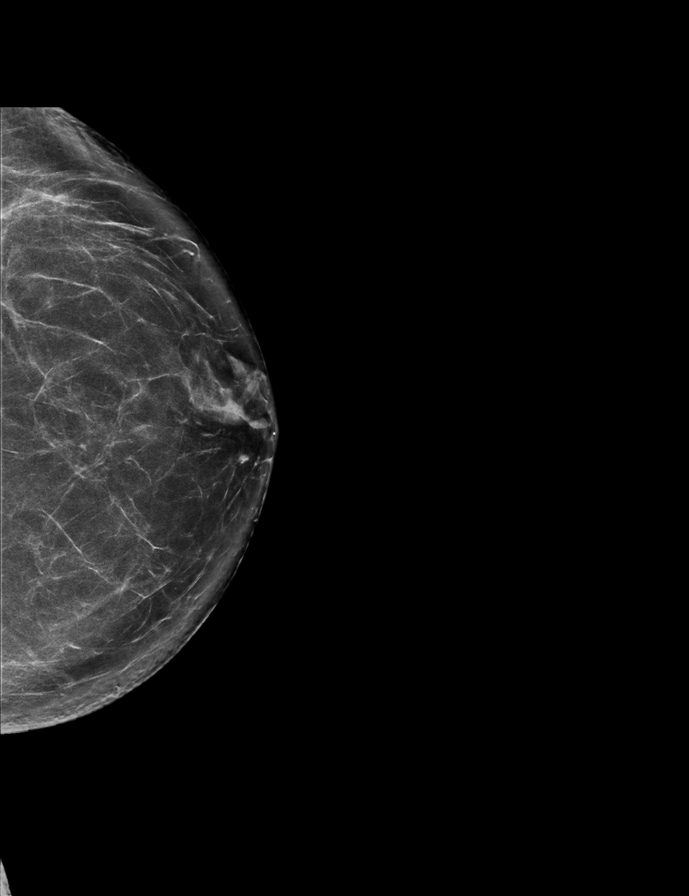

[R CC synth-2D]
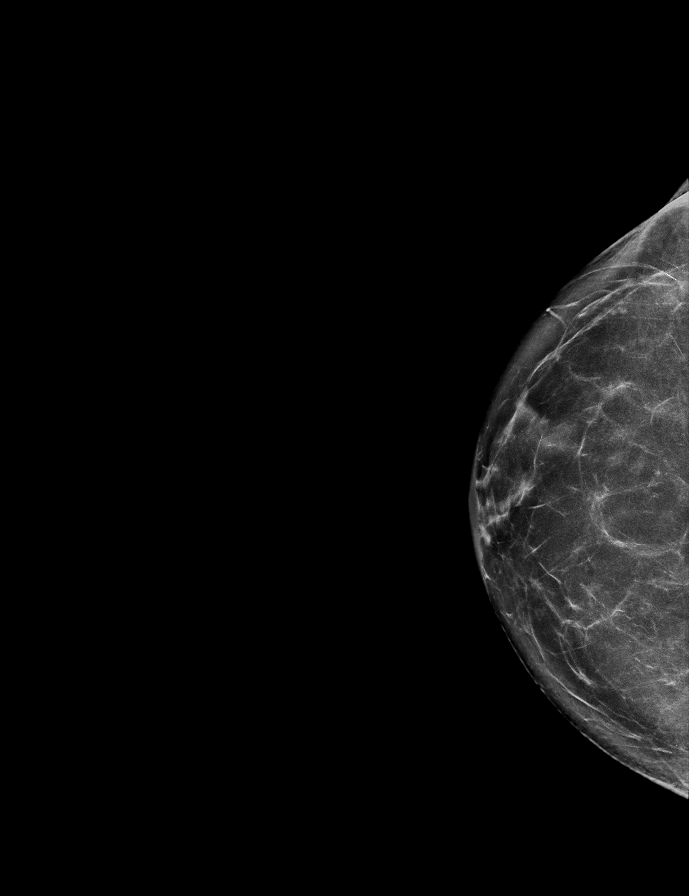

[L MLO tomo · 2 of 75 frames shown]
[frame 25/75]
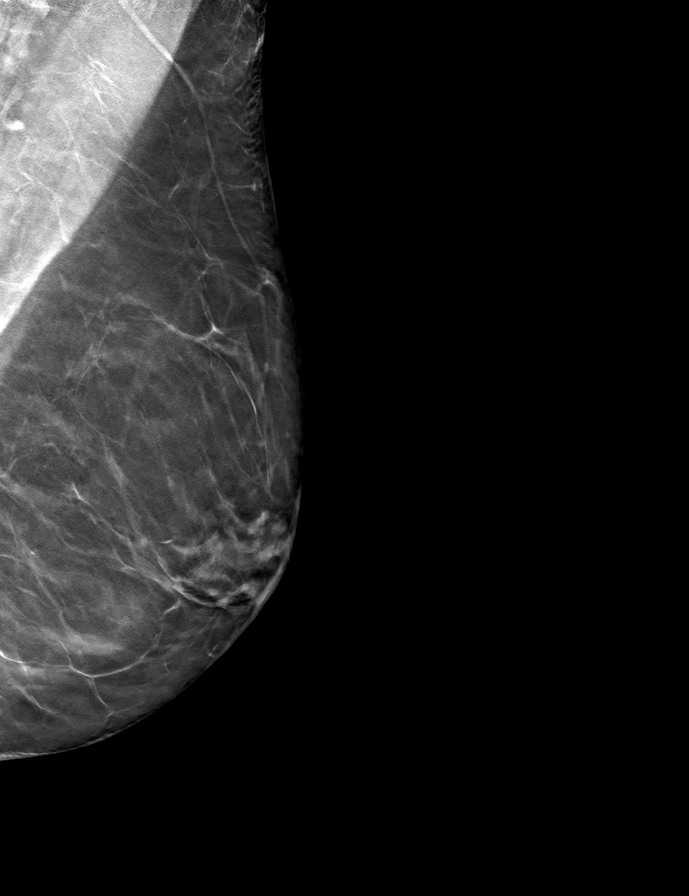
[frame 38/75]
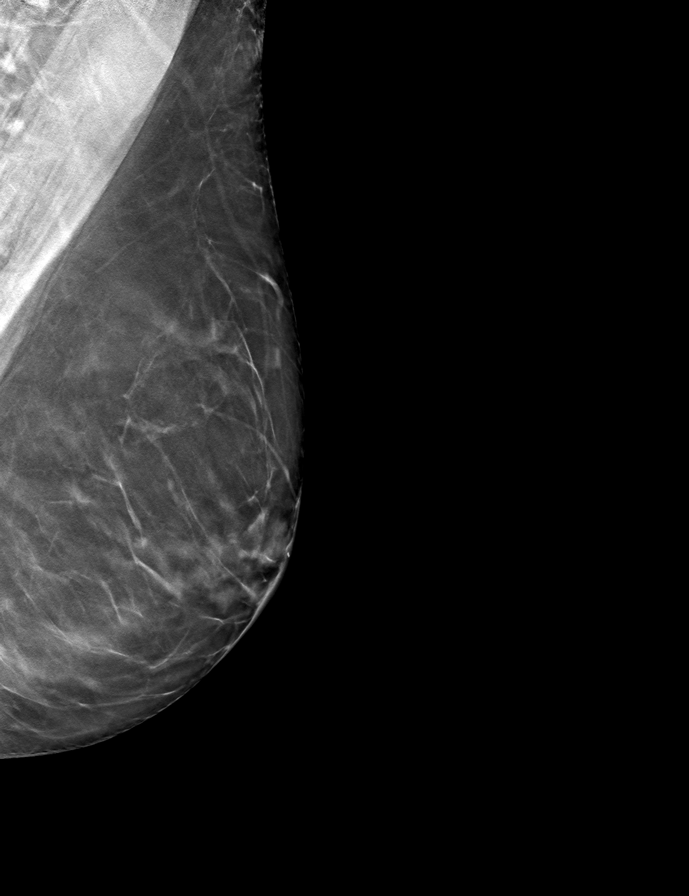

[R MLO tomo · tomo slice 37/73.0]
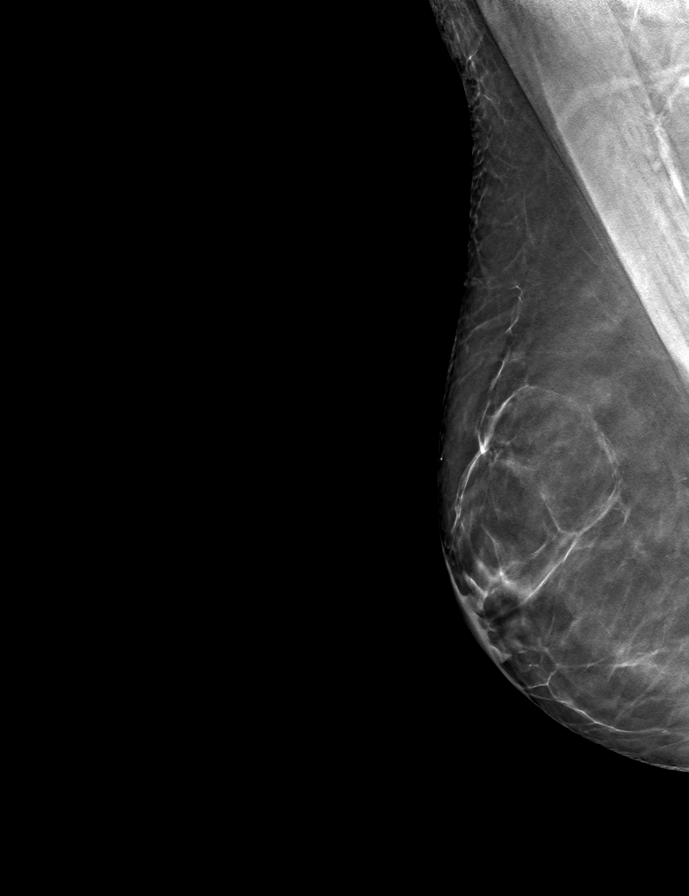

[R CC tomo · tomo slice 35/70.0]
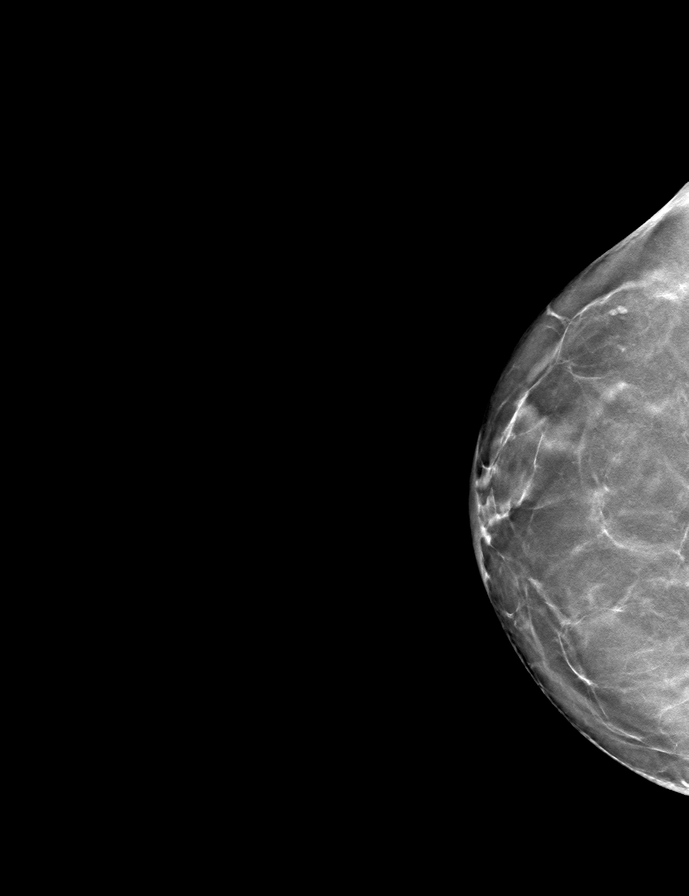

[L CC tomo · tomo slice 38/75.0]
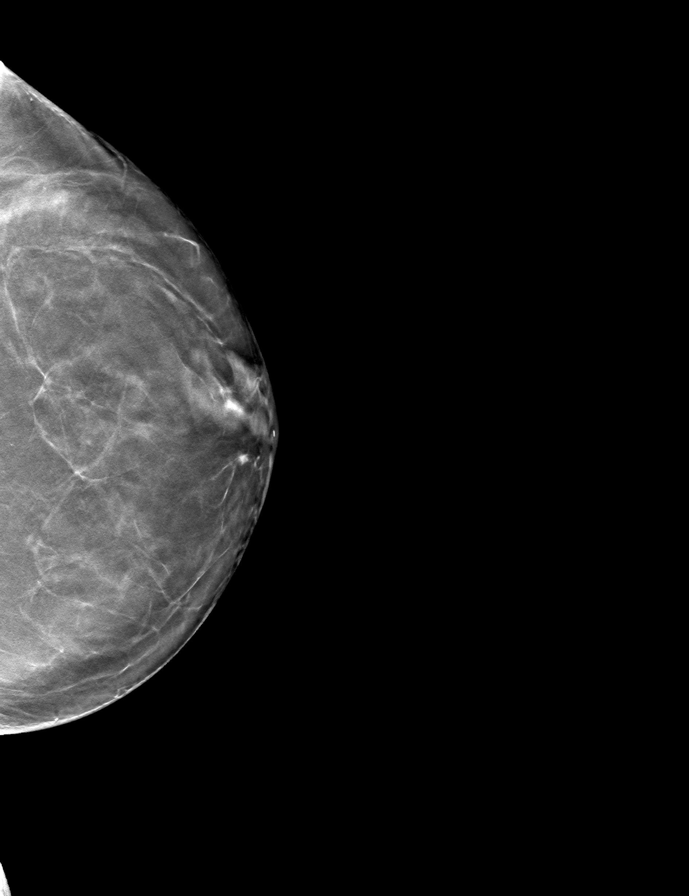

[9 of 24 positions shown; findings below may reference images not displayed]

ACR Breast Density Category c: The breast tissue is heterogeneously
dense, which may obscure small masses.
FINDINGS: There are no findings suspicious for malignancy. Images were
processed with CAD.
IMPRESSION: No mammographic evidence of malignancy. A result letter of this
screening mammogram will be mailed directly to the patient.

RECOMMENDATION:
Screening mammogram in one year. (Code:[id number]-[id number])

BI-RADS CATEGORY  1: Negative.

## 2020-09-08 DIAGNOSIS — Z23 Encounter for immunization: Secondary | ICD-10-CM | POA: Diagnosis not present

## 2020-10-19 DIAGNOSIS — I10 Essential (primary) hypertension: Secondary | ICD-10-CM | POA: Diagnosis not present

## 2020-10-19 DIAGNOSIS — E7800 Pure hypercholesterolemia, unspecified: Secondary | ICD-10-CM | POA: Diagnosis not present

## 2020-10-24 DIAGNOSIS — Z780 Asymptomatic menopausal state: Secondary | ICD-10-CM | POA: Diagnosis not present

## 2020-10-24 DIAGNOSIS — M810 Age-related osteoporosis without current pathological fracture: Secondary | ICD-10-CM | POA: Diagnosis not present

## 2020-10-24 DIAGNOSIS — E7800 Pure hypercholesterolemia, unspecified: Secondary | ICD-10-CM | POA: Diagnosis not present

## 2020-10-24 DIAGNOSIS — Z0001 Encounter for general adult medical examination with abnormal findings: Secondary | ICD-10-CM | POA: Diagnosis not present

## 2020-10-24 DIAGNOSIS — K589 Irritable bowel syndrome without diarrhea: Secondary | ICD-10-CM | POA: Diagnosis not present

## 2020-10-24 DIAGNOSIS — F411 Generalized anxiety disorder: Secondary | ICD-10-CM | POA: Diagnosis not present

## 2020-10-24 DIAGNOSIS — I10 Essential (primary) hypertension: Secondary | ICD-10-CM | POA: Diagnosis not present

## 2020-12-19 DIAGNOSIS — I10 Essential (primary) hypertension: Secondary | ICD-10-CM | POA: Diagnosis not present

## 2021-01-16 DIAGNOSIS — I10 Essential (primary) hypertension: Secondary | ICD-10-CM | POA: Diagnosis not present

## 2021-01-16 DIAGNOSIS — E7800 Pure hypercholesterolemia, unspecified: Secondary | ICD-10-CM | POA: Diagnosis not present

## 2021-01-30 DIAGNOSIS — E7800 Pure hypercholesterolemia, unspecified: Secondary | ICD-10-CM | POA: Diagnosis not present

## 2021-01-30 DIAGNOSIS — I10 Essential (primary) hypertension: Secondary | ICD-10-CM | POA: Diagnosis not present

## 2021-02-14 DIAGNOSIS — L821 Other seborrheic keratosis: Secondary | ICD-10-CM | POA: Diagnosis not present

## 2021-02-14 DIAGNOSIS — I10 Essential (primary) hypertension: Secondary | ICD-10-CM | POA: Diagnosis not present

## 2021-02-14 DIAGNOSIS — L814 Other melanin hyperpigmentation: Secondary | ICD-10-CM | POA: Diagnosis not present

## 2021-02-14 DIAGNOSIS — D225 Melanocytic nevi of trunk: Secondary | ICD-10-CM | POA: Diagnosis not present

## 2021-02-14 DIAGNOSIS — E7800 Pure hypercholesterolemia, unspecified: Secondary | ICD-10-CM | POA: Diagnosis not present

## 2021-03-04 DIAGNOSIS — Z01419 Encounter for gynecological examination (general) (routine) without abnormal findings: Secondary | ICD-10-CM | POA: Diagnosis not present

## 2021-03-04 DIAGNOSIS — Z124 Encounter for screening for malignant neoplasm of cervix: Secondary | ICD-10-CM | POA: Diagnosis not present

## 2021-03-04 DIAGNOSIS — Z1231 Encounter for screening mammogram for malignant neoplasm of breast: Secondary | ICD-10-CM | POA: Diagnosis not present

## 2021-03-28 DIAGNOSIS — E7800 Pure hypercholesterolemia, unspecified: Secondary | ICD-10-CM | POA: Diagnosis not present

## 2021-03-28 DIAGNOSIS — I10 Essential (primary) hypertension: Secondary | ICD-10-CM | POA: Diagnosis not present

## 2021-04-01 ENCOUNTER — Other Ambulatory Visit: Payer: Self-pay | Admitting: Internal Medicine

## 2021-04-01 DIAGNOSIS — E7800 Pure hypercholesterolemia, unspecified: Secondary | ICD-10-CM

## 2021-05-14 ENCOUNTER — Ambulatory Visit
Admission: RE | Admit: 2021-05-14 | Discharge: 2021-05-14 | Disposition: A | Discharge status: Home / Self Care | Payer: Medicare Other | Source: Ambulatory Visit | Attending: Internal Medicine | Admitting: Internal Medicine

## 2021-05-14 ENCOUNTER — Other Ambulatory Visit: Payer: Self-pay

## 2021-05-14 DIAGNOSIS — I2510 Atherosclerotic heart disease of native coronary artery without angina pectoris: Secondary | ICD-10-CM | POA: Diagnosis not present

## 2021-05-14 DIAGNOSIS — E7800 Pure hypercholesterolemia, unspecified: Secondary | ICD-10-CM

## 2021-08-30 DIAGNOSIS — H18523 Epithelial (juvenile) corneal dystrophy, bilateral: Secondary | ICD-10-CM | POA: Diagnosis not present

## 2021-08-30 DIAGNOSIS — Z961 Presence of intraocular lens: Secondary | ICD-10-CM | POA: Diagnosis not present

## 2021-08-30 DIAGNOSIS — H524 Presbyopia: Secondary | ICD-10-CM | POA: Diagnosis not present

## 2021-08-30 DIAGNOSIS — H04123 Dry eye syndrome of bilateral lacrimal glands: Secondary | ICD-10-CM | POA: Diagnosis not present

## 2021-08-30 DIAGNOSIS — H179 Unspecified corneal scar and opacity: Secondary | ICD-10-CM | POA: Diagnosis not present

## 2021-09-17 DIAGNOSIS — Z23 Encounter for immunization: Secondary | ICD-10-CM | POA: Diagnosis not present

## 2021-10-07 DIAGNOSIS — R300 Dysuria: Secondary | ICD-10-CM | POA: Diagnosis not present

## 2021-10-24 DIAGNOSIS — E7801 Familial hypercholesterolemia: Secondary | ICD-10-CM | POA: Diagnosis not present

## 2021-10-24 DIAGNOSIS — I10 Essential (primary) hypertension: Secondary | ICD-10-CM | POA: Diagnosis not present

## 2021-10-24 DIAGNOSIS — M810 Age-related osteoporosis without current pathological fracture: Secondary | ICD-10-CM | POA: Diagnosis not present

## 2021-10-24 DIAGNOSIS — Z7982 Long term (current) use of aspirin: Secondary | ICD-10-CM | POA: Diagnosis not present

## 2021-10-28 DIAGNOSIS — Z23 Encounter for immunization: Secondary | ICD-10-CM | POA: Diagnosis not present

## 2021-10-28 DIAGNOSIS — R197 Diarrhea, unspecified: Secondary | ICD-10-CM | POA: Diagnosis not present

## 2021-10-28 DIAGNOSIS — I10 Essential (primary) hypertension: Secondary | ICD-10-CM | POA: Diagnosis not present

## 2021-10-28 DIAGNOSIS — E7800 Pure hypercholesterolemia, unspecified: Secondary | ICD-10-CM | POA: Diagnosis not present

## 2021-10-28 DIAGNOSIS — M810 Age-related osteoporosis without current pathological fracture: Secondary | ICD-10-CM | POA: Diagnosis not present

## 2021-10-28 DIAGNOSIS — Z0000 Encounter for general adult medical examination without abnormal findings: Secondary | ICD-10-CM | POA: Diagnosis not present

## 2021-10-28 DIAGNOSIS — I2584 Coronary atherosclerosis due to calcified coronary lesion: Secondary | ICD-10-CM | POA: Diagnosis not present

## 2021-10-28 DIAGNOSIS — R8281 Pyuria: Secondary | ICD-10-CM | POA: Diagnosis not present

## 2021-10-28 DIAGNOSIS — I2510 Atherosclerotic heart disease of native coronary artery without angina pectoris: Secondary | ICD-10-CM | POA: Diagnosis not present

## 2021-10-28 DIAGNOSIS — E871 Hypo-osmolality and hyponatremia: Secondary | ICD-10-CM | POA: Diagnosis not present

## 2021-11-05 DIAGNOSIS — R197 Diarrhea, unspecified: Secondary | ICD-10-CM | POA: Diagnosis not present

## 2022-02-20 DIAGNOSIS — D044 Carcinoma in situ of skin of scalp and neck: Secondary | ICD-10-CM | POA: Diagnosis not present

## 2022-02-20 DIAGNOSIS — L821 Other seborrheic keratosis: Secondary | ICD-10-CM | POA: Diagnosis not present

## 2022-02-20 DIAGNOSIS — L814 Other melanin hyperpigmentation: Secondary | ICD-10-CM | POA: Diagnosis not present

## 2022-04-16 ENCOUNTER — Other Ambulatory Visit (INDEPENDENT_AMBULATORY_CARE_PROVIDER_SITE_OTHER): Payer: Medicare Other

## 2022-04-16 ENCOUNTER — Encounter: Payer: Self-pay | Admitting: Physician Assistant

## 2022-04-16 ENCOUNTER — Ambulatory Visit (INDEPENDENT_AMBULATORY_CARE_PROVIDER_SITE_OTHER): Payer: Medicare Other | Admitting: Physician Assistant

## 2022-04-16 VITALS — BP 138/90 | HR 87 | Ht 61.5 in | Wt 122.6 lb

## 2022-04-16 DIAGNOSIS — A09 Infectious gastroenteritis and colitis, unspecified: Secondary | ICD-10-CM

## 2022-04-16 LAB — COMPREHENSIVE METABOLIC PANEL
ALT: 51 U/L — ABNORMAL HIGH (ref 0–35)
AST: 38 U/L — ABNORMAL HIGH (ref 0–37)
Albumin: 4.1 g/dL (ref 3.5–5.2)
Alkaline Phosphatase: 65 U/L (ref 39–117)
BUN: 17 mg/dL (ref 6–23)
CO2: 26 mEq/L (ref 19–32)
Calcium: 9 mg/dL (ref 8.4–10.5)
Chloride: 103 mEq/L (ref 96–112)
Creatinine, Ser: 0.61 mg/dL (ref 0.40–1.20)
GFR: 87.83 mL/min (ref >60.00)
Glucose, Bld: 120 mg/dL — ABNORMAL HIGH (ref 70–99)
Potassium: 3.6 mEq/L (ref 3.5–5.1)
Sodium: 137 mEq/L (ref 135–145)
Total Bilirubin: 0.8 mg/dL (ref 0.2–1.2)
Total Protein: 7.1 g/dL (ref 6.0–8.3)

## 2022-04-16 LAB — CBC WITH DIFFERENTIAL/PLATELET
Basophils Absolute: 0 10*3/uL (ref 0.0–0.1)
Basophils Relative: 0.3 % (ref 0.0–3.0)
Eosinophils Absolute: 0.1 10*3/uL (ref 0.0–0.7)
Eosinophils Relative: 2 % (ref 0.0–5.0)
HCT: 41.7 % (ref 36.0–46.0)
Hemoglobin: 14.2 g/dL (ref 12.0–15.0)
Lymphocytes Relative: 11.7 % — ABNORMAL LOW (ref 12.0–46.0)
Lymphs Abs: 0.6 10*3/uL — ABNORMAL LOW (ref 0.7–4.0)
MCHC: 34.1 g/dL (ref 30.0–36.0)
MCV: 97.2 fl (ref 78.0–100.0)
Monocytes Absolute: 0.5 10*3/uL (ref 0.1–1.0)
Monocytes Relative: 8.8 % (ref 3.0–12.0)
Neutro Abs: 4.3 10*3/uL (ref 1.4–7.7)
Neutrophils Relative %: 77.2 % — ABNORMAL HIGH (ref 43.0–77.0)
Platelets: 149 10*3/uL — ABNORMAL LOW (ref 150.0–400.0)
RBC: 4.29 Mil/uL (ref 3.87–5.11)
RDW: 12.2 % (ref 11.5–15.5)
WBC: 5.5 10*3/uL (ref 4.0–10.5)

## 2022-04-16 MED ORDER — CHOLESTYRAMINE 4 G PO PACK: 4.0000 g | PACK | Freq: Two times a day (BID) | ORAL | 2 refills | Status: DC

## 2022-04-16 NOTE — Patient Instructions (Signed)
If you are age 75 or older, your body mass index should be between 23-30. Your Body mass index is 22.79 kg/m?Marland Kitchen If this is out of the aforementioned range listed, please consider follow up with your Primary Care Provider. ? ?If you are age 35 or younger, your body mass index should be between 19-25. Your Body mass index is 22.79 kg/m?Marland Kitchen If this is out of the aformentioned range listed, please consider follow up with your Primary Care Provider.  ? ?Your provider has requested that you go to the basement level for lab work before leaving today. Press "B" on the elevator. The lab is located at the first door on the left as you exit the elevator. ? ?We have sent the following medications to your pharmacy for you to pick up at your convenience: ?Cholestyramine 4 g twice daily. ? ? ?The [place] [facility] providers would like to encourage you to use [facility] to communicate with providers for non-urgent requests or questions.  Due to long hold times on the telephone, sending your provider a message by [facility] may be a faster and more efficient way to get a response.  Please allow 48 business hours for a response.  Please remember that this is for non-urgent requests.  ? ?It was a pleasure to see you today! ? ?Thank you for trusting me with your gastrointestinal care!   ? ?Ellouise Newer, PA-C  ? ?

## 2022-04-16 NOTE — Progress Notes (Signed)
? ?Chief Complaint: Diarrhea ? ?HPI: ?   [name] is a 76 year old female with past medical history as listed below, known to Dr. Carlean Purl, who was referred to me by Deland Pretty, MD for a complaint of diarrhea. ?   12/28/2014 colonoscopy was completely normal.  Repeat recommended in 10 years if she wanted to consider. ?   10/24/2021 CMP with a sodium minimally decreased at 132, chloride 94 and minimally decreased albumin/globulin ratio at 1.  Otherwise normal.  C. difficile negative. ?   Today, the patient presents to clinic and tells me that back in November of last year she had diarrhea for about 2 to 3 weeks.  She tells me all the testing was negative as above and eventually she went back to her normal bowel habits.  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  Tells me though that over the past month she has had nothing but loose urgent stools to the point where now she is having to wear a pad due to some accidents.  She has even had some feces leak out while she is asleep.  She did start a baby aspirin around November timeframe but otherwise has no changes in medicines.  She is on turmeric and fiber supplement daily.  Describes that she had her gallbladder out years ago.  Associated symptoms include some occasional abdominal cramping.  Currently she is using Imodium 4 times a day to slow things down. ?   Denies fever, chills, weight loss, blood in her stool, nausea or vomiting. ? ?Past Medical History:  ?Diagnosis Date  ? 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 Cataract   ? Hyperlipidemia   ? Hypertension   ? ? ?Past Surgical History:  ?Procedure Laterality Date  ? APPENDECTOMY    ? CHOLECYSTECTOMY    ? COLONOSCOPY  2005 and 2016  ? ? ?Current Outpatient Medications  ?Medication Sig Dispense Refill  ? atenolol (TENORMIN) 25 MG tablet Take 25 mg by mouth daily.     ? atorvastatin (LIPITOR) 20 MG tablet Take 20 mg by  mouth daily at 6 PM.     ? Calcium Carb-Cholecalciferol (CALCIUM + D3) 600-200 MG-UNIT TABS Take 600 mg by mouth daily.    ? Docosahexaenoic Acid (DHA PO) Take 580 mg by mouth daily.    ? lisinopril-hydrochlorothiazide (PRINZIDE,ZESTORETIC) 20-12.5 MG per tablet Take 1 tablet by mouth daily.     ? LORazepam (ATIVAN) 1 MG tablet     ? psyllium (REGULOID) 0.52 G capsule Take 0.52 g by mouth daily.    ? ?No current facility-administered medications for this visit.  ? ? ?Allergies as of 04/16/2022 - Review Complete 04/16/2022  ?Allergen Reaction Noted  ? Ivp dye  [iodinated contrast media] Hives 12/11/2014  ? ? ?Family History  ?Problem Relation Age of Onset  ? Colon polyps Mother   ? Colon cancer Paternal Aunt   ? Esophageal cancer Neg Hx   ? Rectal cancer Neg Hx   ? Stomach cancer Neg Hx   ? Breast cancer Neg Hx   ? ? ?Social History  ? ?Socioeconomic History  ? Marital status: Widowed  ?  Spouse name: Not on file  ? Number of children: Not on file  ? Years of education: Not on file  ? Highest education level: Not on file  ?Occupational History  ? Not on file  ?Tobacco Use  ?  Smoking status: Former  ? Smokeless tobacco: Never  ?Substance and Sexual Activity  ? Alcohol use: Yes  ?  Alcohol/week: 0.0 standard drinks  ?  Comment: socially  ? Drug use: No  ? Sexual activity: Yes  ?Other Topics Concern  ? Not on file  ?Social History Narrative  ? Not on file  ? ?Social Determinants of Health  ? ?Financial Resource Strain: Not on file  ?Food Insecurity: Not on file  ?Transportation Needs: Not on file  ?Physical Activity: Not on file  ?Stress: Not on file  ?Social Connections: Not on file  ?Intimate Partner Violence: Not on file  ? ? ?Review of Systems:    ?Constitutional: No weight loss, fever or chills ?Skin: No rash  ?Cardiovascular: No chest pain  ?Respiratory: No SOB  ?Gastrointestinal: See HPI and otherwise negative ?Genitourinary: No dysuria  ?Neurological: No headache, dizziness or syncope ?Musculoskeletal: No new  muscle or joint pain ?Hematologic: No bleeding  ?Psychiatric: No history of depression or anxiety ? ? Physical Exam:  ?Vital signs: ?BP 138/90   Pulse 87   Ht 5' 1.5" (1.562 m)   Wt 122 lb 9.6 oz (55.6 kg)   BMI 22.79 kg/m?   ?Constitutional:   Pleasant Elderly Caucasian female appears to be in NAD, Well developed, Well nourished, alert and cooperative ?Head:  Normocephalic and atraumatic. ?Eyes:   PEERL, EOMI. No icterus. Conjunctiva pink. ?Ears:  Normal auditory acuity. ?Neck:  Supple ?Throat: Oral cavity and pharynx without inflammation, swelling or lesion.  ?Respiratory: Respirations even and unlabored. Lungs clear to auscultation bilaterally.   No wheezes, crackles, or rhonchi.  ?Cardiovascular: Normal S1, S2. No MRG. Regular rate and rhythm. No peripheral edema, cyanosis or pallor.  ?Gastrointestinal:  Soft, nondistended, nontender. No rebound or guarding.  Increased bowel sounds all 4 quadrants. No appreciable masses or hepatomegaly. ?Rectal:  Not performed.  ?Msk:  Symmetrical without gross deformities. Without edema, no deformity or joint abnormality.  ?Neurologic:  Alert and  oriented x4;  grossly normal neurologically.  ?Skin:   Dry and intact without significant lesions or rashes. ?Psychiatric: Demonstrates good judgement and reason without abnormal affect or behaviors. ? ?See HPI for most recent labs. ? ?Assessment: ?1.  Diarrhea: Distant diagnosis of IBS-D, but this seems different per the patient, frequent diarrhea over the past month only helped by Imodium; consider infectious cause versus inflammatory versus bile salt induced versus IBS ? ?Plan: ?1.  Ordered CBC, CMP and celiac studies ?2.  Ordered stools for GI profile, C. difficile, O&P, fecal calprotectin, fecal pancreatic elastase and lactoferrin ?3.  Started the patient on Cholestyramine 4 g twice daily #60 with 2 refills ?4.  Told the patient she can continue Imodium as needed ?5.  Patient follow in clinic per recommendations after stool  studies and labs above.  If needed we could consider repeating her colonoscopy as it is now been 7 years. ? ?Ellouise Newer, PA-C ?[facility] ?04/16/2022, 11:11 AM ? ?Cc: Deland Pretty, MD  ?

## 2022-04-17 LAB — IGA: Immunoglobulin A: 240 mg/dL (ref 70–320)

## 2022-04-17 LAB — TISSUE TRANSGLUTAMINASE, IGA: (tTG) Ab, IgA: <1 U/mL

## 2022-04-18 ENCOUNTER — Other Ambulatory Visit: Payer: Medicare Other

## 2022-04-18 DIAGNOSIS — A048 Other specified bacterial intestinal infections: Secondary | ICD-10-CM | POA: Diagnosis not present

## 2022-04-18 DIAGNOSIS — Z0389 Encounter for observation for other suspected diseases and conditions ruled out: Secondary | ICD-10-CM | POA: Diagnosis not present

## 2022-04-18 DIAGNOSIS — A09 Infectious gastroenteritis and colitis, unspecified: Secondary | ICD-10-CM

## 2022-04-21 LAB — CLOSTRIDIUM DIFFICILE BY PCR: Toxigenic C. Difficile by PCR: NEGATIVE

## 2022-04-22 LAB — GI PROFILE, STOOL, PCR

## 2022-04-22 LAB — FECAL LACTOFERRIN, QUANT
Fecal Lactoferrin: NEGATIVE
MICRO NUMBER:: 13357941
SPECIMEN QUALITY:: ADEQUATE

## 2022-04-24 LAB — OVA AND PARASITE EXAMINATION
CONCENTRATE RESULT:: NONE SEEN
MICRO NUMBER:: 13357650
SPECIMEN QUALITY:: ADEQUATE
TRICHROME RESULT:: NONE SEEN

## 2022-04-24 LAB — CALPROTECTIN, FECAL: Calprotectin, Fecal: 33 ug/g (ref 0–120)

## 2022-04-24 LAB — PANCREATIC ELASTASE, FECAL: Pancreatic Elastase-1, Stool: 383 mcg/g

## 2022-04-29 ENCOUNTER — Other Ambulatory Visit: Payer: Self-pay

## 2022-04-29 DIAGNOSIS — A09 Infectious gastroenteritis and colitis, unspecified: Secondary | ICD-10-CM

## 2022-04-29 DIAGNOSIS — K529 Noninfective gastroenteritis and colitis, unspecified: Secondary | ICD-10-CM

## 2022-05-06 ENCOUNTER — Ambulatory Visit (AMBULATORY_SURGERY_CENTER): Payer: Medicare Other | Admitting: Internal Medicine

## 2022-05-06 ENCOUNTER — Encounter: Payer: Self-pay | Admitting: Internal Medicine

## 2022-05-06 VITALS — BP 163/62 | HR 59 | Temp 97.7°F | Resp 13 | Ht 61.0 in | Wt 122.0 lb

## 2022-05-06 DIAGNOSIS — R159 Full incontinence of feces: Secondary | ICD-10-CM | POA: Diagnosis not present

## 2022-05-06 DIAGNOSIS — K529 Noninfective gastroenteritis and colitis, unspecified: Secondary | ICD-10-CM | POA: Diagnosis not present

## 2022-05-06 DIAGNOSIS — R197 Diarrhea, unspecified: Secondary | ICD-10-CM | POA: Diagnosis not present

## 2022-05-06 DIAGNOSIS — K649 Unspecified hemorrhoids: Secondary | ICD-10-CM | POA: Diagnosis not present

## 2022-05-06 MED ORDER — SODIUM CHLORIDE 0.9 % IV SOLN: 500.0000 mL | Freq: Once | INTRAVENOUS | Status: DC

## 2022-05-06 NOTE — Progress Notes (Signed)
Called to room to assist during endoscopic procedure.  Patient ID and intended procedure confirmed with present staff. Received instructions for my participation in the procedure from the performing physician.  

## 2022-05-06 NOTE — Progress Notes (Signed)
PT taken to PACU. Monitors in place. VSS. Report given to RN. 

## 2022-05-06 NOTE — Op Note (Signed)
[facility] Patient Name: Connie Gilbert Procedure Date: 05/06/2022 3:00 PM MRN: [id number] Endoscopist: Gatha Mayer , MD Age: 76 Referring MD:  Date of Birth: Apr 16, 1946 Gender: Female Account #: [id number] Procedure:                Colonoscopy Indications:              Chronic diarrhea, Clinically significant diarrhea                            of unexplained origin - some fecal incontinence Medicines:                Monitored Anesthesia Care Procedure:                Pre-Anesthesia Assessment:                           - Prior to the procedure, a History and Physical                            was performed, and patient medications and                            allergies were reviewed. The patient's tolerance of                            previous anesthesia was also reviewed. The risks                            and benefits of the procedure and the sedation                            options and risks were discussed with the patient.                            All questions were answered, and informed consent                            was obtained. Prior Anticoagulants: The patient has                            taken no previous anticoagulant or antiplatelet                            agents. ASA Grade Assessment: II - A patient with                            mild systemic disease. After reviewing the risks                            and benefits, the patient was deemed in                            satisfactory condition to undergo the procedure.  After obtaining informed consent, the colonoscope                            was passed under direct vision. Throughout the                            procedure, the patient's blood pressure, pulse, and                            oxygen saturations were monitored continuously. The                            Olympus PCF-H190DL [id number]) Colonoscope was                            introduced through  the anus and advanced to the the                            terminal ileum, with identification of the                            appendiceal orifice and IC valve. The colonoscopy                            was performed without difficulty. The patient                            tolerated the procedure well. The quality of the                            bowel preparation was good. The terminal ileum,                            ileocecal valve, appendiceal orifice, and rectum                            were photographed. Scope In: 3:15:11 PM Scope Out: 3:29:40 PM Scope Withdrawal Time: 0 hours 9 minutes 19 seconds  Total Procedure Duration: 0 hours 14 minutes 29 seconds  Findings:                 Hemorrhoids were found on perianal exam.                           The terminal ileum appeared normal.                           The entire examined colon appeared normal on direct                            and retroflexion views.                           Biopsies for histology were taken with a cold  forceps from the ascending colon, transverse colon,                            descending colon, sigmoid colon and rectum for                            evaluation of microscopic colitis. Complications:            No immediate complications. Estimated Blood Loss:     Estimated blood loss was minimal. Impression:               - Hemorrhoids found on perianal exam.                           - The examined portion of the ileum was normal.                           - The entire examined colon is normal on direct and                            retroflexion views. Normal anal tone at rest and                            appropriate voluntary tone.                           - Biopsies were taken with a cold forceps from the                            ascending colon, transverse colon, descending                            colon, sigmoid colon and rectum for evaluation of                             microscopic colitis. Recommendation:           - Patient has a contact number available for                            emergencies. The signs and symptoms of potential                            delayed complications were discussed with the                            patient. Return to normal activities tomorrow.                            Written discharge instructions were provided to the                            patient.                           - Resume previous diet.                           -  Continue present medications. loperamide                            (Imosium) is helpful.                           - Await pathology results.                           - Stop artificial sweeteners to see if that helps/                           - No repeat colonoscopy due to age. Gatha Mayer, MD 05/06/2022 3:39:19 PM This report has been signed electronically.

## 2022-05-06 NOTE — Progress Notes (Signed)
Pt's states no medical or surgical changes since previsit or office visit. 

## 2022-05-06 NOTE — Progress Notes (Signed)
History and Physical Interval Note:  05/06/2022 3:04 PM  Connie Gilbert  has presented today for endoscopic procedure(s), with the diagnosis of  Encounter Diagnosis  Name Primary?   Chronic diarrhea Yes  .  The various methods of evaluation and treatment have been discussed with the patient and/or family. After consideration of risks, benefits and other options for treatment, the patient has consented to  the endoscopic procedure(s).   The patient's history has been reviewed, patient examined, no change in status, stable for endoscopic procedure(s).  I have reviewed the patient's chart and labs.  Questions were answered to the patient's satisfaction.     Gatha Mayer, MD, [facility]

## 2022-05-06 NOTE — Patient Instructions (Addendum)
I did not see any abnormalities to explain the diarrhea but took biopsies to check for microscopic inflammation. I will let you know results. Continue with Imodium as you are, since that helps. It is very safe to use that.  Splenda and other artificial sweeteners could contribute to diarrhea so reasonable to stop these and see if that helps.  I appreciate the opportunity to care for you. Gatha Mayer, MD, [facility]   Handouts on hemorrhoids given.  YOU HAD AN ENDOSCOPIC PROCEDURE TODAY AT [facility]:   Refer to the procedure report that was given to you for any specific questions about what was found during the examination.  If the procedure report does not answer your questions, please call your gastroenterologist to clarify.  If you requested that your care partner not be given the details of your procedure findings, then the procedure report has been included in a sealed envelope for you to review at your convenience later.  YOU SHOULD EXPECT: Some feelings of bloating in the abdomen. Passage of more gas than usual.  Walking can help get rid of the air that was put into your GI tract during the procedure and reduce the bloating. If you had a lower endoscopy (such as a colonoscopy or flexible sigmoidoscopy) you may notice spotting of blood in your stool or on the toilet paper. If you underwent a bowel prep for your procedure, you may not have a normal bowel movement for a few days.  Please Note:  You might notice some irritation and congestion in your nose or some drainage.  This is from the oxygen used during your procedure.  There is no need for concern and it should clear up in a day or so.  SYMPTOMS TO REPORT IMMEDIATELY:  Following lower endoscopy (colonoscopy or flexible sigmoidoscopy):  Excessive amounts of blood in the stool  Significant tenderness or worsening of abdominal pains  Swelling of the abdomen that is new, acute  Fever of 100F or higher   For urgent or  emergent issues, a gastroenterologist can be reached at any hour by calling [phone]. Do not use MyChart messaging for urgent concerns.    DIET:  We do recommend a small meal at first, but then you may proceed to your regular diet.  Drink plenty of fluids but you should avoid alcoholic beverages for 24 hours.  ACTIVITY:  You should plan to take it easy for the rest of today and you should NOT DRIVE or use heavy machinery until tomorrow (because of the sedation medicines used during the test).    FOLLOW UP: Our staff will call the number listed on your records 48-72 hours following your procedure to check on you and address any questions or concerns that you may have regarding the information given to you following your procedure. If we do not reach you, we will leave a message.  We will attempt to reach you two times.  During this call, we will ask if you have developed any symptoms of COVID 19. If you develop any symptoms (ie: fever, flu-like symptoms, shortness of breath, cough etc.) before then, please call [phone].  If you test positive for Covid 19 in the 2 weeks post procedure, please call and report this information to [place].    If any biopsies were taken you will be contacted by phone or by letter within the next 1-3 weeks.  Please call us at [phone] if you have not heard about the biopsies in  3 weeks.    SIGNATURES/CONFIDENTIALITY: You and/or your care partner have signed paperwork which will be entered into your electronic medical record.  These signatures attest to the fact that that the information above on your After Visit Summary has been reviewed and is understood.  Full responsibility of the confidentiality of this discharge information lies with you and/or your care-partner.

## 2022-05-07 ENCOUNTER — Telehealth: Payer: Self-pay | Admitting: *Deleted

## 2022-05-07 NOTE — Telephone Encounter (Signed)
  Follow up Call-     05/06/2022    2:35 PM  Call back number  Post procedure Call Back phone  # [phone]  Permission to leave phone message Yes     Patient questions:  Do you have a fever, pain , or abdominal swelling? No. Pain Score  0 *  Have you tolerated food without any problems? Yes.    Have you been able to return to your normal activities? Yes.    Do you have any questions about your discharge instructions: Diet   No. Medications  No. Follow up visit  No.  Do you have questions or concerns about your Care? No.  Actions: * If pain score is 4 or above: No action needed, pain <4.

## 2022-05-19 ENCOUNTER — Telehealth: Payer: Self-pay

## 2022-05-19 DIAGNOSIS — R7989 Other specified abnormal findings of blood chemistry: Secondary | ICD-10-CM

## 2022-05-19 NOTE — Telephone Encounter (Signed)
MyChart message sent to patient with lab reminder. Lab order in epic. 

## 2022-05-19 NOTE — Telephone Encounter (Signed)
-----   Message from Yevette Edwards, RN sent at 04/18/2022  8:59 AM EDT ----- Regarding: Labs Hepatic function panel - need to enter order

## 2022-05-21 NOTE — Telephone Encounter (Signed)
Spoke with patient to remind her that she is due for repeat labs at this time. No appointment is necessary. Patient is aware that she can stop by the lab in the basement at her convenience between 7:30 AM - 5 PM, Monday through Friday. Patient verbalized understanding and had no concerns at the end of the call.   

## 2022-05-22 ENCOUNTER — Other Ambulatory Visit (INDEPENDENT_AMBULATORY_CARE_PROVIDER_SITE_OTHER): Payer: Medicare Other

## 2022-05-22 DIAGNOSIS — R7989 Other specified abnormal findings of blood chemistry: Secondary | ICD-10-CM

## 2022-05-22 LAB — HEPATIC FUNCTION PANEL
ALT: 57 U/L — ABNORMAL HIGH (ref 0–35)
AST: 46 U/L — ABNORMAL HIGH (ref 0–37)
Albumin: 4.1 g/dL (ref 3.5–5.2)
Alkaline Phosphatase: 86 U/L (ref 39–117)
Bilirubin, Direct: 0.1 mg/dL (ref 0.0–0.3)
Total Bilirubin: 0.7 mg/dL (ref 0.2–1.2)
Total Protein: 7.3 g/dL (ref 6.0–8.3)

## 2022-05-23 ENCOUNTER — Other Ambulatory Visit: Payer: Self-pay

## 2022-05-23 DIAGNOSIS — R7989 Other specified abnormal findings of blood chemistry: Secondary | ICD-10-CM

## 2022-05-23 DIAGNOSIS — I2510 Atherosclerotic heart disease of native coronary artery without angina pectoris: Secondary | ICD-10-CM

## 2022-05-26 ENCOUNTER — Other Ambulatory Visit (INDEPENDENT_AMBULATORY_CARE_PROVIDER_SITE_OTHER): Payer: Medicare Other

## 2022-05-26 DIAGNOSIS — R7989 Other specified abnormal findings of blood chemistry: Secondary | ICD-10-CM | POA: Diagnosis not present

## 2022-05-26 DIAGNOSIS — I2510 Atherosclerotic heart disease of native coronary artery without angina pectoris: Secondary | ICD-10-CM

## 2022-05-26 LAB — PROTIME-INR
INR: 1.2 ratio — ABNORMAL HIGH (ref 0.8–1.0)
Prothrombin Time: 13.2 s — ABNORMAL HIGH (ref 9.6–13.1)

## 2022-05-26 LAB — IBC + FERRITIN
Ferritin: 132.6 ng/mL (ref 10.0–291.0)
Iron: 88 ug/dL (ref 42–145)
Saturation Ratios: 25.1 % (ref 20.0–50.0)
TIBC: 350 ug/dL (ref 250.0–450.0)
Transferrin: 250 mg/dL (ref 212.0–360.0)

## 2022-05-30 LAB — HEPATITIS C ANTIBODY
Hepatitis C Ab: NONREACTIVE
SIGNAL TO CUT-OFF: 0.17 (ref <1.00)

## 2022-05-30 LAB — ANTI-NUCLEAR AB-TITER (ANA TITER): ANA Titer 1: 1:1280 {titer} — ABNORMAL HIGH

## 2022-05-30 LAB — HEPATITIS A ANTIBODY, TOTAL: Hepatitis A AB,Total: REACTIVE — AB

## 2022-05-30 LAB — HEPATITIS B SURFACE ANTIGEN: Hepatitis B Surface Ag: NONREACTIVE

## 2022-05-30 LAB — ANTI-SMOOTH MUSCLE ANTIBODY, IGG: Actin (Smooth Muscle) Antibody (IGG): <20 U (ref <20)

## 2022-05-30 LAB — ANA: Anti Nuclear Antibody (ANA): POSITIVE — AB

## 2022-05-30 LAB — CERULOPLASMIN: Ceruloplasmin: 25 mg/dL (ref 18–53)

## 2022-05-30 LAB — HEPATITIS B SURFACE ANTIBODY,QUALITATIVE: Hep B S Ab: NONREACTIVE

## 2022-05-30 LAB — ALPHA-1-ANTITRYPSIN: A-1 Antitrypsin, Ser: 132 mg/dL (ref 83–199)

## 2022-05-30 LAB — MITOCHONDRIAL ANTIBODIES: Mitochondrial M2 Ab, IgG: <=20 U (ref <=20.0)

## 2022-06-11 ENCOUNTER — Ambulatory Visit (HOSPITAL_COMMUNITY)
Admission: RE | Admit: 2022-06-11 | Discharge: 2022-06-11 | Disposition: A | Discharge status: Home / Self Care | Payer: Medicare Other | Source: Ambulatory Visit | Attending: Physician Assistant | Admitting: Physician Assistant

## 2022-06-11 DIAGNOSIS — R945 Abnormal results of liver function studies: Secondary | ICD-10-CM | POA: Diagnosis not present

## 2022-06-11 DIAGNOSIS — Z9049 Acquired absence of other specified parts of digestive tract: Secondary | ICD-10-CM | POA: Diagnosis not present

## 2022-06-11 DIAGNOSIS — R7989 Other specified abnormal findings of blood chemistry: Secondary | ICD-10-CM | POA: Insufficient documentation

## 2022-07-14 ENCOUNTER — Encounter: Payer: Self-pay | Admitting: Physician Assistant

## 2022-07-14 ENCOUNTER — Ambulatory Visit (INDEPENDENT_AMBULATORY_CARE_PROVIDER_SITE_OTHER): Payer: Medicare Other | Admitting: Physician Assistant

## 2022-07-14 VITALS — BP 140/70 | HR 74 | Ht 61.0 in | Wt 123.0 lb

## 2022-07-14 DIAGNOSIS — K760 Fatty (change of) liver, not elsewhere classified: Secondary | ICD-10-CM | POA: Diagnosis not present

## 2022-07-14 DIAGNOSIS — R7989 Other specified abnormal findings of blood chemistry: Secondary | ICD-10-CM | POA: Diagnosis not present

## 2022-07-14 DIAGNOSIS — I2510 Atherosclerotic heart disease of native coronary artery without angina pectoris: Secondary | ICD-10-CM | POA: Diagnosis not present

## 2022-07-14 DIAGNOSIS — K529 Noninfective gastroenteritis and colitis, unspecified: Secondary | ICD-10-CM

## 2022-07-14 NOTE — Progress Notes (Signed)
Chief Complaint: Follow-up diarrhea and elevated LFTs  HPI:    Connie Gilbert is a 76 year old female with a past medical history as listed below, known to Dr. Carlean Purl, who returns to clinic today for follow-up of her diarrhea.      12/28/2014 colonoscopy was completely normal.  Repeat recommended in 10 years if she wanted to consider.    10/24/2021 CMP with a sodium minimally decreased at 132, chloride 94 and minimally decreased albumin/globulin ratio at 1.  Otherwise normal.  C. difficile negative.    04/16/2022 office visit with me and described diarrhea off and on but over the past month and had urgent loose stools.  Also some fecal incontinence.  At that time started the patient on Cholestyramine 4 g twice daily.  Labs at that time showed an AST elevated at 38 and ALT of 51.  Stool studies were normal.  She was scheduled for a colonoscopy.    05/06/2022 colonoscopy was normal.  Biopsies were normal.  She had been asked to stop artificial sweeteners to see if that helped at all.    05/22/2022 LFTs remain elevated AST 46 and ALT 57.  Patient then had work-up for elevated LFTs.  All testing was normal.  Her ANA was positive and she was told to discuss this further with her PCP.  Ultrasound showed fatty liver.    Today, the patient tells me that after time of the colonoscopy she decreased down to 2 Imodium a day and then decreased to 1 and then eventually stopped.  She really had been fine over the past couple of weeks but now over the past week or so she has noticed an increase in "gurgling" and more frequent stools and feels like her symptoms are coming back.  Tells me though that her stools are still solid at this point not liquid.  Tells me she does not think she saw any benefit from Colestipol in the past.  She is not happy that we have not really found out why she is having loose stools.    Denies fever, chills, blood in her stool, weight loss, nausea or vomiting.  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     aspirin EC 81 MG tablet Take 81 mg by mouth daily. Swallow whole.     atenolol (TENORMIN) 25 MG tablet Take 25 mg by mouth 2 (two) times daily.     atorvastatin (LIPITOR) 20 MG tablet Take 20 mg by mouth daily at 6 PM.      Calcium Carb-Cholecalciferol (CALCIUM + D3) 600-200 MG-UNIT TABS Take 600 mg by mouth daily.     D 1000 25 MCG (1000 UT) capsule Take 1,000 Units by mouth daily.     lisinopril (ZESTRIL) 20 MG tablet      LORazepam (ATIVAN) 1 MG tablet      olmesartan (BENICAR) 40 MG tablet Take 40 mg by mouth daily.     psyllium (REGULOID) 0.52 G capsule Take 0.52 g by mouth daily.     Turmeric (QC TUMERIC COMPLEX  PO) Take 400 mg by mouth daily.     chlorthalidone (HYGROTON) 25 MG tablet  (Patient not taking: Reported on 07/14/2022)     loperamide (IMODIUM) 2 MG capsule Take 2 mg by mouth as needed for diarrhea or loose stools. (Patient not taking: Reported on 07/14/2022)     No current facility-administered medications for this visit.    Allergies as of 07/14/2022 - Review Complete 07/14/2022  Allergen Reaction Noted   Alendronate sodium Other (See Comments) 10/28/2021   Iodinated contrast media Hives and Other (See Comments) 12/11/2014   Nitrofurantoin Other (See Comments) 10/28/2021    Family History  Problem Relation Age of Onset   Colon polyps Mother    Colon cancer Paternal Aunt    Esophageal cancer Neg Hx    Rectal cancer Neg Hx    Stomach cancer Neg Hx    Breast cancer Neg Hx     Social History   Socioeconomic History   Marital status: Widowed    Spouse name: Not on file   Number of  children: 1   Years of education: Not on file   Highest education level: Not on file  Occupational History   Not on file  Tobacco Use   Smoking status: Former   Smokeless tobacco: Never  Vaping Use   Vaping Use: Never used  Substance and Sexual Activity   Alcohol use: Yes    Alcohol/week: 0.0 standard drinks of alcohol    Comment: socially   Drug use: No   Sexual activity: Yes  Other Topics Concern   Not on file  Social History Narrative   Not on file   Social Determinants of Health   Financial Resource Strain: Not on file  Food Insecurity: Not on file  Transportation Needs: Not on file  Physical Activity: Not on file  Stress: Not on file  Social Connections: Not on file  Intimate Partner Violence: Not on file    Review of Systems:    Constitutional: No weight loss, fever or chills Cardiovascular: No chest pain   Respiratory: No SOB  Gastrointestinal: See HPI and otherwise negative   Physical Exam:  Vital signs: BP 140/70   Pulse 74   Ht '5\' 1"'$  (1.549 m)   Wt 123 lb (55.8 kg)   BMI 23.24 kg/m    Constitutional:   Caucasian female appears to be in NAD, Well developed, Well nourished, alert and cooperative Respiratory: Respirations even and unlabored. Lungs clear to auscultation bilaterally.   No wheezes, crackles, or rhonchi.  Cardiovascular: Normal S1, S2. No MRG. Regular rate and rhythm. No peripheral edema, cyanosis or pallor.  Gastrointestinal:  Soft, nondistended, nontender. No rebound or guarding. Normal bowel sounds. No appreciable masses or hepatomegaly. Rectal:  Not performed.  Psychiatric: Oriented to person, place and time. Demonstrates good judgement and reason without abnormal affect or behaviors.  RELEVANT LABS AND IMAGING: CBC    Component Value Date/Time   WBC 5.5 04/16/2022 1144   RBC 4.29 04/16/2022 1144   HGB 14.2 04/16/2022 1144   HCT 41.7 04/16/2022 1144   PLT 149.0 (L) 04/16/2022 1144   MCV 97.2 04/16/2022 1144   MCV 98.9 (A)  07/11/2013 1824   MCH 32.7 (A) 07/11/2013 1824   MCHC 34.1 04/16/2022 1144   RDW 12.2 04/16/2022 1144   LYMPHSABS 0.6 (L) 04/16/2022 1144   MONOABS 0.5 04/16/2022 1144   EOSABS 0.1 04/16/2022 1144   BASOSABS 0.0 04/16/2022 1144    CMP     Component Value  Date/Time   NA 137 04/16/2022 1144   K 3.6 04/16/2022 1144   CL 103 04/16/2022 1144   CO2 26 04/16/2022 1144   GLUCOSE 120 (H) 04/16/2022 1144   BUN 17 04/16/2022 1144   CREATININE 0.61 04/16/2022 1144   CALCIUM 9.0 04/16/2022 1144   PROT 7.3 05/22/2022 1000   ALBUMIN 4.1 05/22/2022 1000   AST 46 (H) 05/22/2022 1000   ALT 57 (H) 05/22/2022 1000   ALKPHOS 86 05/22/2022 1000   BILITOT 0.7 05/22/2022 1000    Assessment: 1.  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  Fatty liver: With minimal elevation in LFTs  Plan: 1.  Patient is upset that we cannot find a distinct cause for her diarrhea.  She feels like symptoms are returning after having a few good weeks.  I did suggest that we try Colestipol instead of the Cholestyramine as this is easier to titrate up, it could be that she was not on enough of this medicine to see a change.  She declines for now. 2.  Also recommend she restart her Imodium. 3.  Discussed fatty liver and minimal elevation in LFTs.  Discussed variation in diet. 4.  Encouraged patient to follow-up with Dr. Carlean Purl himself to see if he had any further recommendations for her given diarrhea.  We scheduled this in the next couple of months.  Ellouise Newer, PA-C [facility] 07/14/2022, 9:27 AM  Cc: Deland Pretty, MD

## 2022-07-14 NOTE — Patient Instructions (Addendum)
If you are age 75 or older, your body mass index should be between 23-30. Your Body mass index is 23.24 kg/m. If this is out of the aforementioned range listed, please consider follow up with your Primary Care Provider.  If you are age 50 or younger, your body mass index should be between 19-25. Your Body mass index is 23.24 kg/m. If this is out of the aformentioned range listed, please consider follow up with your Primary Care Provider.   You have been scheduled for a follow up with Dr.Gessner  09/02/22 '@9'$ :30am   The [facility] providers would like to encourage you to use [facility] to communicate with providers for non-urgent requests or questions.  Due to long hold times on the telephone, sending your provider a message by [facility] may be a faster and more efficient way to get a response.  Please allow 48 business hours for a response.  Please remember that this is for non-urgent requests.   It was a pleasure to see you today!  Thank you for trusting me with your gastrointestinal care!    Ellouise Newer , PA-C

## 2022-09-02 ENCOUNTER — Ambulatory Visit (INDEPENDENT_AMBULATORY_CARE_PROVIDER_SITE_OTHER): Payer: Medicare Other | Admitting: Internal Medicine

## 2022-09-02 ENCOUNTER — Other Ambulatory Visit (INDEPENDENT_AMBULATORY_CARE_PROVIDER_SITE_OTHER): Payer: Medicare Other

## 2022-09-02 ENCOUNTER — Encounter: Payer: Self-pay | Admitting: Internal Medicine

## 2022-09-02 VITALS — BP 128/82 | HR 91 | Ht 61.0 in | Wt 126.0 lb

## 2022-09-02 DIAGNOSIS — K580 Irritable bowel syndrome with diarrhea: Secondary | ICD-10-CM | POA: Diagnosis not present

## 2022-09-02 DIAGNOSIS — R748 Abnormal levels of other serum enzymes: Secondary | ICD-10-CM | POA: Diagnosis not present

## 2022-09-02 DIAGNOSIS — R932 Abnormal findings on diagnostic imaging of liver and biliary tract: Secondary | ICD-10-CM | POA: Diagnosis not present

## 2022-09-02 DIAGNOSIS — R768 Other specified abnormal immunological findings in serum: Secondary | ICD-10-CM

## 2022-09-02 DIAGNOSIS — I2510 Atherosclerotic heart disease of native coronary artery without angina pectoris: Secondary | ICD-10-CM

## 2022-09-02 LAB — HEPATIC FUNCTION PANEL
ALT: 40 U/L — ABNORMAL HIGH (ref 0–35)
AST: 32 U/L (ref 0–37)
Albumin: 3.9 g/dL (ref 3.5–5.2)
Alkaline Phosphatase: 78 U/L (ref 39–117)
Bilirubin, Direct: 0.1 mg/dL (ref 0.0–0.3)
Total Bilirubin: 0.6 mg/dL (ref 0.2–1.2)
Total Protein: 7.4 g/dL (ref 6.0–8.3)

## 2022-09-02 NOTE — Progress Notes (Signed)
Connie Gilbert 76 y.o. August 09, 1946 [id number]  Assessment & Plan:   Encounter Diagnoses  Name Primary?   Irritable bowel syndrome with diarrhea Yes   Abnormal transaminases    ANA positive    Abnormal liver ultrasound     Cause of flare of diarrhea not clear based upon colonoscopy and other testing.  Fortunately she is improved though still frustrated that we did not know why she had this flare of diarrhea.  She will continue loperamide as needed.  She takes olmesartan but is back to normal so do not think that was the cause of her diarrheal problems, it can cause a celiac-like illness.   She does not fit the typical profile for fatty liver as suggested by ultrasound.  It certainly is possible to be thin and have fatty liver, however.  She has a significantly elevated antinuclear antibody which may not be relevant but does make me wonder about the possibility of autoimmune hepatitis.  Recheck LFTs and an IgG level today.  Does not have any myalgias to suggest muscle source of the transaminases.  Orders Placed This Encounter  Procedures   IgG   Hepatic function panel   CC: Deland Pretty, MD     Subjective:   Chief Complaint: Diarrhea history of LFT elevation  HPI 76 year old white woman with a history of IBS who had increase in diarrhea problems late last year and earlier this year with negative stool studies for infection and subsequently had a colonoscopy in May with normal random biopsies.  She was tried on cholestyramine and colestipol with variable success and eventually got better though is frustrated that we did not have a concrete diagnosis for her.  At this point she is not even using as needed loperamide.  She has also had abnormal transaminases with an extensive serologic evaluation and has a markedly elevated antinuclear antibody.  June 11, 2022 ultrasound demonstrated increased echogenicity of the liver that could be consistent with hepatic steatosis and an 11 mm  bile duct, postcholecystectomy. 05/26/2022 ANA Titer 1 titer 1:1,280 High    Comment:                 Reference Range                  <1:40        Negative                  1:40-1:80    Low Antibody Level                  >1:80        Elevated Antibody Level  .   ANA Pattern 1  Nuclear, Homogeneous Abnormal     Mitochondrial antibody smooth muscle antibodies alpha-1 antitrypsin, ceruloplasmin iron studies hepatitis AB and C serologies all negative.  An INR was normal. Transaminases were 51 and 38 in May and 57 and 46 in June.  Bilirubin alk phos albumin all normal as was total protein.  She also had negative fecal calprotectin pancreatic elastase and tissue transglutaminase antibody and IgA level. Allergies  Allergen Reactions   Alendronate Sodium Other (See Comments)   Iodinated Contrast Media Hives and Other (See Comments)    Uncoded Allergy. Allergen: ivp dye   Nitrofurantoin Other (See Comments)   Current Meds  Medication Sig   amLODipine (NORVASC) 5 MG tablet Take 5 mg by mouth daily.   aspirin EC 81 MG tablet Take 81 mg by mouth daily. Swallow  whole.   atenolol (TENORMIN) 25 MG tablet Take 25 mg by mouth 2 (two) times daily.   atorvastatin (LIPITOR) 20 MG tablet Take 20 mg by mouth daily at 6 PM.    Calcium Carb-Cholecalciferol (CALCIUM + D3) 600-200 MG-UNIT TABS Take 600 mg by mouth daily.   chlorthalidone (HYGROTON) 25 MG tablet    D 1000 25 MCG (1000 UT) capsule Take 1,000 Units by mouth daily.   lisinopril (ZESTRIL) 20 MG tablet    loperamide (IMODIUM) 2 MG capsule Take 2 mg by mouth as needed for diarrhea or loose stools.   LORazepam (ATIVAN) 1 MG tablet    olmesartan (BENICAR) 40 MG tablet Take 40 mg by mouth daily.   psyllium (REGULOID) 0.52 G capsule Take 0.52 g by mouth daily.   Turmeric (QC TUMERIC COMPLEX PO) Take 400 mg by mouth daily.   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   Review of Systems See HPI  Objective:   Physical Exam BP 128/82   Pulse 91   Ht 5' 1"  (1.549 m)   Wt 126 lb (57.2 kg)   BMI 23.81 kg/m   @BP  128/82   Pulse 91   Ht 5' 1"  (1.549 m)   Wt 126 lb (57.2 kg)   BMI 23.81 kg/m @  General:  NAD Eyes:   anicteric Lungs:  clear Heart::  S1S2 no rubs, murmurs or gallops Abdomen:  soft and nontender, BS+  No stigmata chronic liver disease seen     Data Reviewed:  See the HPI

## 2022-09-02 NOTE — Patient Instructions (Signed)
Your provider has requested that you go to the basement level for lab work before leaving today. Press "B" on the elevator. The lab is located at the first door on the left as you exit the elevator.  The [facility] providers would like to encourage you to use [facility] to communicate with providers for non-urgent requests or questions.  Due to long hold times on the telephone, sending your provider a message by [facility] may be a faster and more efficient way to get a response.  Please allow 48 business hours for a response.  Please remember that this is for non-urgent requests.   Due to recent changes in healthcare laws, you may see the results of your imaging and laboratory studies on MyChart before your provider has had a chance to review them.  We understand that in some cases there may be results that are confusing or concerning to you. Not all laboratory results come back in the same time frame and the provider may be waiting for multiple results in order to interpret others.  Please give [place] 48 hours in order for your provider to thoroughly review all the results before contacting the office for clarification of your results.    Follow up depending on results.

## 2022-09-03 LAB — IGG: IgG (Immunoglobin G), Serum: 1440 mg/dL (ref 600–1540)

## 2022-09-08 DIAGNOSIS — Z23 Encounter for immunization: Secondary | ICD-10-CM | POA: Diagnosis not present

## 2022-10-10 ENCOUNTER — Ambulatory Visit: Payer: Medicare Other | Admitting: Internal Medicine

## 2022-10-27 DIAGNOSIS — M810 Age-related osteoporosis without current pathological fracture: Secondary | ICD-10-CM | POA: Diagnosis not present

## 2022-10-27 DIAGNOSIS — I10 Essential (primary) hypertension: Secondary | ICD-10-CM | POA: Diagnosis not present

## 2022-10-28 DIAGNOSIS — I10 Essential (primary) hypertension: Secondary | ICD-10-CM | POA: Diagnosis not present

## 2022-10-29 DIAGNOSIS — M542 Cervicalgia: Secondary | ICD-10-CM | POA: Diagnosis not present

## 2022-10-30 DIAGNOSIS — I2510 Atherosclerotic heart disease of native coronary artery without angina pectoris: Secondary | ICD-10-CM | POA: Diagnosis not present

## 2022-10-30 DIAGNOSIS — M2550 Pain in unspecified joint: Secondary | ICD-10-CM | POA: Diagnosis not present

## 2022-10-30 DIAGNOSIS — F411 Generalized anxiety disorder: Secondary | ICD-10-CM | POA: Diagnosis not present

## 2022-10-30 DIAGNOSIS — I10 Essential (primary) hypertension: Secondary | ICD-10-CM | POA: Diagnosis not present

## 2022-10-30 DIAGNOSIS — Z0000 Encounter for general adult medical examination without abnormal findings: Secondary | ICD-10-CM | POA: Diagnosis not present

## 2022-10-30 DIAGNOSIS — R768 Other specified abnormal immunological findings in serum: Secondary | ICD-10-CM | POA: Diagnosis not present

## 2022-10-30 DIAGNOSIS — M810 Age-related osteoporosis without current pathological fracture: Secondary | ICD-10-CM | POA: Diagnosis not present

## 2022-11-18 DIAGNOSIS — M79641 Pain in right hand: Secondary | ICD-10-CM | POA: Diagnosis not present

## 2022-11-18 DIAGNOSIS — M79642 Pain in left hand: Secondary | ICD-10-CM | POA: Diagnosis not present

## 2022-11-18 DIAGNOSIS — R768 Other specified abnormal immunological findings in serum: Secondary | ICD-10-CM | POA: Diagnosis not present

## 2022-11-18 DIAGNOSIS — I10 Essential (primary) hypertension: Secondary | ICD-10-CM | POA: Diagnosis not present

## 2022-11-18 DIAGNOSIS — M1990 Unspecified osteoarthritis, unspecified site: Secondary | ICD-10-CM | POA: Diagnosis not present

## 2022-11-20 DIAGNOSIS — M7989 Other specified soft tissue disorders: Secondary | ICD-10-CM | POA: Diagnosis not present

## 2022-11-20 DIAGNOSIS — M2550 Pain in unspecified joint: Secondary | ICD-10-CM | POA: Diagnosis not present

## 2022-11-20 DIAGNOSIS — M130 Polyarthritis, unspecified: Secondary | ICD-10-CM | POA: Diagnosis not present

## 2022-11-20 DIAGNOSIS — R700 Elevated erythrocyte sedimentation rate: Secondary | ICD-10-CM | POA: Diagnosis not present

## 2022-11-20 DIAGNOSIS — M79671 Pain in right foot: Secondary | ICD-10-CM | POA: Diagnosis not present

## 2022-11-20 DIAGNOSIS — Z1159 Encounter for screening for other viral diseases: Secondary | ICD-10-CM | POA: Diagnosis not present

## 2022-11-20 DIAGNOSIS — M1990 Unspecified osteoarthritis, unspecified site: Secondary | ICD-10-CM | POA: Diagnosis not present

## 2022-11-20 DIAGNOSIS — M79672 Pain in left foot: Secondary | ICD-10-CM | POA: Diagnosis not present

## 2022-11-20 DIAGNOSIS — M79643 Pain in unspecified hand: Secondary | ICD-10-CM | POA: Diagnosis not present

## 2022-12-10 DIAGNOSIS — J3489 Other specified disorders of nose and nasal sinuses: Secondary | ICD-10-CM | POA: Diagnosis not present

## 2022-12-10 DIAGNOSIS — J020 Streptococcal pharyngitis: Secondary | ICD-10-CM | POA: Diagnosis not present

## 2022-12-17 DIAGNOSIS — M130 Polyarthritis, unspecified: Secondary | ICD-10-CM | POA: Diagnosis not present

## 2022-12-17 DIAGNOSIS — M359 Systemic involvement of connective tissue, unspecified: Secondary | ICD-10-CM | POA: Diagnosis not present

## 2022-12-17 DIAGNOSIS — M7989 Other specified soft tissue disorders: Secondary | ICD-10-CM | POA: Diagnosis not present

## 2022-12-17 DIAGNOSIS — R700 Elevated erythrocyte sedimentation rate: Secondary | ICD-10-CM | POA: Diagnosis not present

## 2022-12-17 DIAGNOSIS — M1990 Unspecified osteoarthritis, unspecified site: Secondary | ICD-10-CM | POA: Diagnosis not present

## 2022-12-17 DIAGNOSIS — M79643 Pain in unspecified hand: Secondary | ICD-10-CM | POA: Diagnosis not present

## 2023-01-01 DIAGNOSIS — I10 Essential (primary) hypertension: Secondary | ICD-10-CM | POA: Diagnosis not present

## 2023-01-30 DIAGNOSIS — I10 Essential (primary) hypertension: Secondary | ICD-10-CM | POA: Diagnosis not present

## 2023-02-18 DIAGNOSIS — J329 Chronic sinusitis, unspecified: Secondary | ICD-10-CM | POA: Diagnosis not present

## 2023-02-18 DIAGNOSIS — M359 Systemic involvement of connective tissue, unspecified: Secondary | ICD-10-CM | POA: Diagnosis not present

## 2023-02-18 DIAGNOSIS — Z79899 Other long term (current) drug therapy: Secondary | ICD-10-CM | POA: Diagnosis not present

## 2023-02-18 DIAGNOSIS — M2550 Pain in unspecified joint: Secondary | ICD-10-CM | POA: Diagnosis not present

## 2023-02-18 DIAGNOSIS — M1990 Unspecified osteoarthritis, unspecified site: Secondary | ICD-10-CM | POA: Diagnosis not present

## 2023-02-23 DIAGNOSIS — D2262 Melanocytic nevi of left upper limb, including shoulder: Secondary | ICD-10-CM | POA: Diagnosis not present

## 2023-02-23 DIAGNOSIS — L814 Other melanin hyperpigmentation: Secondary | ICD-10-CM | POA: Diagnosis not present

## 2023-02-23 DIAGNOSIS — L821 Other seborrheic keratosis: Secondary | ICD-10-CM | POA: Diagnosis not present

## 2023-02-23 DIAGNOSIS — D225 Melanocytic nevi of trunk: Secondary | ICD-10-CM | POA: Diagnosis not present

## 2023-02-23 DIAGNOSIS — L570 Actinic keratosis: Secondary | ICD-10-CM | POA: Diagnosis not present

## 2023-02-23 DIAGNOSIS — Z85828 Personal history of other malignant neoplasm of skin: Secondary | ICD-10-CM | POA: Diagnosis not present

## 2023-03-24 DIAGNOSIS — R197 Diarrhea, unspecified: Secondary | ICD-10-CM | POA: Diagnosis not present

## 2023-03-24 DIAGNOSIS — R7401 Elevation of levels of liver transaminase levels: Secondary | ICD-10-CM | POA: Diagnosis not present

## 2023-03-24 DIAGNOSIS — K625 Hemorrhage of anus and rectum: Secondary | ICD-10-CM | POA: Diagnosis not present

## 2023-03-25 DIAGNOSIS — R197 Diarrhea, unspecified: Secondary | ICD-10-CM | POA: Diagnosis not present

## 2023-03-30 ENCOUNTER — Other Ambulatory Visit (HOSPITAL_BASED_OUTPATIENT_CLINIC_OR_DEPARTMENT_OTHER): Payer: Self-pay

## 2023-03-30 ENCOUNTER — Inpatient Hospital Stay (HOSPITAL_BASED_OUTPATIENT_CLINIC_OR_DEPARTMENT_OTHER)
Admission: EM | Admit: 2023-03-30 | Discharge: 2023-04-02 | DRG: 641 | Disposition: A | Discharge status: Home / Self Care | Payer: Medicare Other | Attending: Internal Medicine | Admitting: Internal Medicine

## 2023-03-30 ENCOUNTER — Other Ambulatory Visit: Payer: Self-pay

## 2023-03-30 ENCOUNTER — Emergency Department (HOSPITAL_BASED_OUTPATIENT_CLINIC_OR_DEPARTMENT_OTHER): Payer: Medicare Other

## 2023-03-30 ENCOUNTER — Encounter (HOSPITAL_BASED_OUTPATIENT_CLINIC_OR_DEPARTMENT_OTHER): Payer: Self-pay | Admitting: Emergency Medicine

## 2023-03-30 DIAGNOSIS — Z87891 Personal history of nicotine dependence: Secondary | ICD-10-CM | POA: Diagnosis not present

## 2023-03-30 DIAGNOSIS — B370 Candidal stomatitis: Secondary | ICD-10-CM | POA: Diagnosis present

## 2023-03-30 DIAGNOSIS — E876 Hypokalemia: Secondary | ICD-10-CM | POA: Diagnosis present

## 2023-03-30 DIAGNOSIS — Z91041 Radiographic dye allergy status: Secondary | ICD-10-CM | POA: Diagnosis not present

## 2023-03-30 DIAGNOSIS — E440 Moderate protein-calorie malnutrition: Secondary | ICD-10-CM | POA: Diagnosis not present

## 2023-03-30 DIAGNOSIS — Z800 Family history of malignant neoplasm of digestive organs: Secondary | ICD-10-CM | POA: Diagnosis not present

## 2023-03-30 DIAGNOSIS — Z6823 Body mass index (BMI) 23.0-23.9, adult: Secondary | ICD-10-CM

## 2023-03-30 DIAGNOSIS — R197 Diarrhea, unspecified: Secondary | ICD-10-CM

## 2023-03-30 DIAGNOSIS — R112 Nausea with vomiting, unspecified: Secondary | ICD-10-CM | POA: Diagnosis present

## 2023-03-30 DIAGNOSIS — Z79899 Other long term (current) drug therapy: Secondary | ICD-10-CM

## 2023-03-30 DIAGNOSIS — Z888 Allergy status to other drugs, medicaments and biological substances status: Secondary | ICD-10-CM

## 2023-03-30 DIAGNOSIS — K580 Irritable bowel syndrome with diarrhea: Secondary | ICD-10-CM | POA: Diagnosis present

## 2023-03-30 DIAGNOSIS — Z7982 Long term (current) use of aspirin: Secondary | ICD-10-CM

## 2023-03-30 DIAGNOSIS — N179 Acute kidney failure, unspecified: Secondary | ICD-10-CM | POA: Diagnosis not present

## 2023-03-30 DIAGNOSIS — E872 Acidosis, unspecified: Secondary | ICD-10-CM | POA: Diagnosis present

## 2023-03-30 DIAGNOSIS — I10 Essential (primary) hypertension: Secondary | ICD-10-CM | POA: Diagnosis not present

## 2023-03-30 DIAGNOSIS — E785 Hyperlipidemia, unspecified: Secondary | ICD-10-CM | POA: Diagnosis present

## 2023-03-30 DIAGNOSIS — R768 Other specified abnormal immunological findings in serum: Secondary | ICD-10-CM | POA: Diagnosis present

## 2023-03-30 DIAGNOSIS — K649 Unspecified hemorrhoids: Secondary | ICD-10-CM | POA: Diagnosis present

## 2023-03-30 DIAGNOSIS — I700 Atherosclerosis of aorta: Secondary | ICD-10-CM | POA: Diagnosis not present

## 2023-03-30 DIAGNOSIS — R1030 Lower abdominal pain, unspecified: Secondary | ICD-10-CM | POA: Diagnosis not present

## 2023-03-30 DIAGNOSIS — Z83719 Family history of colon polyps, unspecified: Secondary | ICD-10-CM | POA: Diagnosis not present

## 2023-03-30 DIAGNOSIS — D696 Thrombocytopenia, unspecified: Secondary | ICD-10-CM | POA: Diagnosis not present

## 2023-03-30 DIAGNOSIS — E860 Dehydration: Secondary | ICD-10-CM | POA: Diagnosis not present

## 2023-03-30 DIAGNOSIS — T3695XA Adverse effect of unspecified systemic antibiotic, initial encounter: Secondary | ICD-10-CM | POA: Diagnosis present

## 2023-03-30 DIAGNOSIS — L949 Localized connective tissue disorder, unspecified: Secondary | ICD-10-CM | POA: Diagnosis not present

## 2023-03-30 DIAGNOSIS — M1990 Unspecified osteoarthritis, unspecified site: Secondary | ICD-10-CM | POA: Diagnosis present

## 2023-03-30 DIAGNOSIS — R34 Anuria and oliguria: Secondary | ICD-10-CM | POA: Diagnosis present

## 2023-03-30 DIAGNOSIS — Z602 Problems related to living alone: Secondary | ICD-10-CM | POA: Diagnosis present

## 2023-03-30 LAB — CBC
HCT: 38.5 % (ref 36.0–46.0)
Hemoglobin: 12.6 g/dL (ref 12.0–15.0)
MCH: 31.1 pg (ref 26.0–34.0)
MCHC: 32.7 g/dL (ref 30.0–36.0)
MCV: 95.1 fL (ref 80.0–100.0)
Platelets: 143 10*3/uL — ABNORMAL LOW (ref 150–400)
RBC: 4.05 MIL/uL (ref 3.87–5.11)
RDW: 14.1 % (ref 11.5–15.5)
WBC: 3.6 10*3/uL — ABNORMAL LOW (ref 4.0–10.5)
nRBC: 0 % (ref 0.0–0.2)

## 2023-03-30 LAB — COMPREHENSIVE METABOLIC PANEL
ALT: 43 U/L (ref 0–44)
AST: 24 U/L (ref 15–41)
Albumin: 4 g/dL (ref 3.5–5.0)
Alkaline Phosphatase: 71 U/L (ref 38–126)
Anion gap: 16 — ABNORMAL HIGH (ref 5–15)
BUN: 76 mg/dL — ABNORMAL HIGH (ref 8–23)
CO2: 18 mmol/L — ABNORMAL LOW (ref 22–32)
Calcium: 10 mg/dL (ref 8.9–10.3)
Chloride: 97 mmol/L — ABNORMAL LOW (ref 98–111)
Creatinine, Ser: 3.13 mg/dL — ABNORMAL HIGH (ref 0.44–1.00)
GFR, Estimated: 15 mL/min — ABNORMAL LOW (ref >60)
Glucose, Bld: 108 mg/dL — ABNORMAL HIGH (ref 70–99)
Potassium: 3.9 mmol/L (ref 3.5–5.1)
Sodium: 131 mmol/L — ABNORMAL LOW (ref 135–145)
Total Bilirubin: 0.6 mg/dL (ref 0.3–1.2)
Total Protein: 7.5 g/dL (ref 6.5–8.1)

## 2023-03-30 LAB — CBC WITH DIFFERENTIAL/PLATELET
Abs Immature Granulocytes: 0.04 10*3/uL (ref 0.00–0.07)
Basophils Absolute: 0.1 10*3/uL (ref 0.0–0.1)
Basophils Relative: 1 %
Eosinophils Absolute: 0.1 10*3/uL (ref 0.0–0.5)
Eosinophils Relative: 2 %
HCT: 44.4 % (ref 36.0–46.0)
Hemoglobin: 14.9 g/dL (ref 12.0–15.0)
Immature Granulocytes: 1 %
Lymphocytes Relative: 18 %
Lymphs Abs: 1 10*3/uL (ref 0.7–4.0)
MCH: 31.3 pg (ref 26.0–34.0)
MCHC: 33.6 g/dL (ref 30.0–36.0)
MCV: 93.3 fL (ref 80.0–100.0)
Monocytes Absolute: 1.4 10*3/uL — ABNORMAL HIGH (ref 0.1–1.0)
Monocytes Relative: 26 %
Neutro Abs: 2.9 10*3/uL (ref 1.7–7.7)
Neutrophils Relative %: 52 %
Platelets: 217 10*3/uL (ref 150–400)
RBC: 4.76 MIL/uL (ref 3.87–5.11)
RDW: 14.3 % (ref 11.5–15.5)
WBC: 5.5 10*3/uL (ref 4.0–10.5)
nRBC: 0 % (ref 0.0–0.2)

## 2023-03-30 LAB — CREATININE, SERUM
Creatinine, Ser: 2.61 mg/dL — ABNORMAL HIGH (ref 0.44–1.00)
GFR, Estimated: 19 mL/min — ABNORMAL LOW (ref >60)

## 2023-03-30 LAB — BASIC METABOLIC PANEL
Anion gap: 17 — ABNORMAL HIGH (ref 5–15)
BUN: 77 mg/dL — ABNORMAL HIGH (ref 8–23)
CO2: 20 mmol/L — ABNORMAL LOW (ref 22–32)
Calcium: 9.2 mg/dL (ref 8.9–10.3)
Chloride: 98 mmol/L (ref 98–111)
Creatinine, Ser: 2.68 mg/dL — ABNORMAL HIGH (ref 0.44–1.00)
GFR, Estimated: 18 mL/min — ABNORMAL LOW (ref >60)
Glucose, Bld: 103 mg/dL — ABNORMAL HIGH (ref 70–99)
Potassium: 5 mmol/L (ref 3.5–5.1)
Sodium: 135 mmol/L (ref 135–145)

## 2023-03-30 LAB — C DIFFICILE QUICK SCREEN W PCR REFLEX
C Diff antigen: NEGATIVE
C Diff interpretation: NOT DETECTED
C Diff toxin: NEGATIVE

## 2023-03-30 LAB — LACTIC ACID, PLASMA
Lactic Acid, Venous: 1.1 mmol/L (ref 0.5–1.9)
Lactic Acid, Venous: 0.9 mmol/L (ref 0.5–1.9)

## 2023-03-30 LAB — LIPASE, BLOOD: Lipase: 31 U/L (ref 11–51)

## 2023-03-30 MED ORDER — SODIUM CHLORIDE 0.9 % IV SOLN: INTRAVENOUS | Status: DC

## 2023-03-30 MED ORDER — ONDANSETRON HCL 4 MG PO TABS
4.0000 mg | ORAL_TABLET | Freq: Four times a day (QID) | ORAL | Status: DC | PRN
  Administered 2023-03-30: 4 mg via ORAL
  Filled 2023-03-30: qty 1

## 2023-03-30 MED ORDER — SODIUM CHLORIDE 0.9 % IV SOLN: Freq: Once | INTRAVENOUS | Status: AC

## 2023-03-30 MED ORDER — NEBIVOLOL HCL 10 MG PO TABS
5.0000 mg | ORAL_TABLET | Freq: Every day | ORAL | Status: DC
  Administered 2023-03-31 – 2023-04-02 (×3): 5 mg via ORAL
  Filled 2023-03-30 (×3): qty 1

## 2023-03-30 MED ORDER — POLYVINYL ALCOHOL 1.4 % OP SOLN
2.0000 [drp] | OPHTHALMIC | Status: DC | PRN
  Administered 2023-03-31: 2 [drp] via OPHTHALMIC
  Filled 2023-03-30: qty 15

## 2023-03-30 MED ORDER — SODIUM CHLORIDE 0.9 % IV BOLUS
2500.0000 mL | Freq: Once | INTRAVENOUS | Status: AC
  Administered 2023-03-30: 2500 mL via INTRAVENOUS

## 2023-03-30 MED ORDER — ONDANSETRON HCL 4 MG/2ML IJ SOLN: 4.0000 mg | Freq: Four times a day (QID) | INTRAMUSCULAR | Status: DC | PRN

## 2023-03-30 MED ORDER — PSYLLIUM 0.52 G PO CAPS: 0.5200 g | ORAL_CAPSULE | Freq: Every day | ORAL | Status: DC

## 2023-03-30 MED ORDER — PSYLLIUM 95 % PO PACK
1.0000 | PACK | Freq: Every day | ORAL | Status: DC
  Administered 2023-03-30 – 2023-04-02 (×3): 1 via ORAL
  Filled 2023-03-30 (×4): qty 1

## 2023-03-30 MED ORDER — ATENOLOL 25 MG PO TABS: 25.0000 mg | ORAL_TABLET | Freq: Two times a day (BID) | ORAL | Status: DC

## 2023-03-30 MED ORDER — AMLODIPINE BESYLATE 5 MG PO TABS
5.0000 mg | ORAL_TABLET | Freq: Every day | ORAL | Status: DC
  Administered 2023-03-30 – 2023-04-02 (×3): 5 mg via ORAL
  Filled 2023-03-30 (×4): qty 1

## 2023-03-30 MED ORDER — ORAL CARE MOUTH RINSE: 15.0000 mL | OROMUCOSAL | Status: DC | PRN

## 2023-03-30 MED ORDER — LOPERAMIDE HCL 2 MG PO CAPS: 2.0000 mg | ORAL_CAPSULE | ORAL | Status: DC | PRN

## 2023-03-30 MED ORDER — ACETAMINOPHEN 650 MG RE SUPP: 650.0000 mg | Freq: Four times a day (QID) | RECTAL | Status: DC | PRN

## 2023-03-30 MED ORDER — METOPROLOL TARTRATE 5 MG/5ML IV SOLN: 5.0000 mg | Freq: Four times a day (QID) | INTRAVENOUS | Status: DC | PRN

## 2023-03-30 MED ORDER — LACTATED RINGERS IV BOLUS
1000.0000 mL | Freq: Once | INTRAVENOUS | Status: AC
  Administered 2023-03-30: 1000 mL via INTRAVENOUS

## 2023-03-30 MED ORDER — ENOXAPARIN SODIUM 30 MG/0.3ML IJ SOSY: 30.0000 mg | PREFILLED_SYRINGE | INTRAMUSCULAR | Status: DC

## 2023-03-30 MED ORDER — ATORVASTATIN CALCIUM 10 MG PO TABS
20.0000 mg | ORAL_TABLET | Freq: Every day | ORAL | Status: DC
  Administered 2023-03-30 – 2023-04-01 (×3): 20 mg via ORAL
  Filled 2023-03-30 (×3): qty 2

## 2023-03-30 MED ORDER — ASPIRIN 81 MG PO TBEC
81.0000 mg | DELAYED_RELEASE_TABLET | Freq: Every day | ORAL | Status: DC
  Administered 2023-04-02: 81 mg via ORAL
  Filled 2023-03-30 (×4): qty 1

## 2023-03-30 MED ORDER — ACETAMINOPHEN 325 MG PO TABS: 650.0000 mg | ORAL_TABLET | Freq: Four times a day (QID) | ORAL | Status: DC | PRN

## 2023-03-30 MED ORDER — SODIUM CHLORIDE 0.9 % IV SOLN: Freq: Once | INTRAVENOUS | Status: DC

## 2023-03-30 NOTE — ED Provider Notes (Signed)
[facility]PART[facility] AT [facility] Provider Note   CSN: [id number] Arrival date & time: 03/30/23  [id number]     History  Chief Complaint  Patient presents with   Diarrhea    Connie Gilbert is a 77 y.o. female.  HPI     77 year old female with a history hypertension, hyperlipidemia, IBS who presents with concern for diarrhea and abdominal cramping for 3 weeks.  Reports for the past 3 weeks, she has had diarrhea every day, after she eats, and every time she sits on the toilet.  Reports she will have around 6 episodes of diarrhea per day although it varies.  Reports that it is yellow.  Denies any black or bloody stools.  Reports some nausea, occasional vomiting, which has improved after she was prescribed Zofran.  She had labs and a viral GI panel obtained which was negative last week.  She has been taking Imodium approximately twice a day.  She reports that this episode is much worse than the episode that she had had last year for which she saw gastroenterology and had an extensive workup.  She does have lower abdominal cramping.  No dysuria, fevers.  Reports fatigue.  Reports she has had 15 pounds of weight loss.  Seen gastroenterology for diarrhea that had increased over the last year--she had had negative stool studies for infection, colonoscopy in May with normal random biopsies, was tried on cholestyramine and colestipol with variable success.  Had mildly elevated transaminases and abnormal ANA-June [place] with possible hepatic steatosis.  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 Authorizing Provider  amLODipine (NORVASC) 5 MG tablet Take 5 mg by mouth daily. 03/06/22    [provider]  aspirin EC 81 MG tablet Take 81 mg by mouth daily. Swallow whole.    [provider]  atenolol (TENORMIN) 25 MG tablet Take 25 mg by mouth 2 (two) times daily.    [provider]  atorvastatin (LIPITOR) 20 MG tablet Take 20 mg by mouth daily at 6 PM.  12/21/10   [provider]  Calcium Carb-Cholecalciferol (CALCIUM + D3) 600-200 MG-UNIT TABS Take 600 mg by mouth daily.    [provider]  chlorthalidone (HYGROTON) 25 MG tablet  02/20/22   [provider]  D 1000 25 MCG (1000 UT) capsule Take 1,000 Units by mouth daily. 02/20/22   [provider]  lisinopril (ZESTRIL) 20 MG tablet  02/20/22   [provider]  loperamide (IMODIUM) 2 MG capsule Take 2 mg by mouth as needed for diarrhea or loose stools.    [provider]  LORazepam (ATIVAN) 1 MG tablet  07/23/09   [provider]  olmesartan (BENICAR) 40 MG tablet Take 40 mg by mouth daily.    [provider]  psyllium (REGULOID) 0.52 G capsule Take 0.52 g by mouth daily.    [provider]  Turmeric (QC TUMERIC COMPLEX PO) Take 400 mg by mouth daily.    [provider]      Allergies    Alendronate sodium, Iodinated contrast media, and Nitrofurantoin    Review of Systems   Review of Systems  Physical Exam  Updated Vital Signs BP 121/65 (BP Location: Right Arm)   Pulse 74   Temp (!) 97.5 F (36.4 C) (Axillary)   Resp 20   SpO2 98%  Physical Exam Vitals and nursing note reviewed.  Constitutional:      General: She is not in acute distress.    Appearance: She is well-developed. She is not diaphoretic.  HENT:     Head: Normocephalic and atraumatic.  Eyes:     Conjunctiva/sclera: Conjunctivae normal.  Cardiovascular:     Rate and Rhythm: Normal rate and regular rhythm.     Heart sounds: Normal heart sounds. No murmur heard.    No friction rub. No gallop.  Pulmonary:     Effort: Pulmonary effort is normal. No  respiratory distress.     Breath sounds: Normal breath sounds. No wheezing or rales.  Abdominal:     General: There is no distension.     Palpations: Abdomen is soft.     Tenderness: There is abdominal tenderness (mild lower abdomen). There is no guarding.  Musculoskeletal:        General: No tenderness.     Cervical back: Normal range of motion.  Skin:    General: Skin is warm and dry.     Findings: No erythema or rash.  Neurological:     Mental Status: She is alert and oriented to person, place, and time.     ED Results / Procedures / Treatments   Labs (all labs ordered are listed, but only abnormal results are displayed) Labs Reviewed  CBC WITH DIFFERENTIAL/PLATELET - Abnormal; Notable for the following components:      Result Value   Monocytes Absolute 1.4 (*)    All other components within normal limits  COMPREHENSIVE METABOLIC PANEL - Abnormal; Notable for the following components:   Sodium 131 (*)    Chloride 97 (*)    CO2 18 (*)    Glucose, Bld 108 (*)    BUN 76 (*)    Creatinine, Ser 3.13 (*)    GFR, Estimated 15 (*)    Anion gap 16 (*)    All other components within normal limits  C DIFFICILE QUICK SCREEN W PCR REFLEX    LIPASE, BLOOD  LACTIC ACID, PLASMA  LACTIC ACID, PLASMA    EKG EKG Interpretation  Date/Time:  Monday March 30 2023 08:55:16 EDT Ventricular Rate:  83 PR Interval:  155 QRS Duration: 88 QT Interval:  363 QTC Calculation: 427 R Axis:   35 Text Interpretation: Sinus rhythm Low voltage, precordial leads No significant change since last tracing Confirmed by Alvira Monday ([id number]) on 03/30/2023 10:47:50 AM  Radiology CT ABDOMEN PELVIS WO CONTRAST  Result Date: 03/30/2023 CLINICAL DATA:  Lower abdominal pain with nausea and diarrhea for the past 3 weeks. EXAM: CT ABDOMEN AND PELVIS WITHOUT CONTRAST TECHNIQUE: Multidetector CT imaging of the abdomen and pelvis was performed following the standard protocol without IV contrast. RADIATION DOSE  REDUCTION: This exam was performed according to the departmental dose-optimization program which includes automated exposure control, adjustment of the mA and/or kV according to patient size and/or use of iterative reconstruction technique. COMPARISON:  Right upper quadrant ultrasound dated June 11, 2022. FINDINGS: Lower chest: No acute abnormality. Hepatobiliary: No focal liver abnormality is seen. Status post cholecystectomy. Mild dilatation of the common bile duct measuring 11-12 mm, likely due to reservoir effect. No intrahepatic biliary dilatation. Pancreas: Unremarkable. No pancreatic ductal dilatation or surrounding inflammatory changes. Spleen: Normal in size without focal abnormality. Adrenals/Urinary  Tract: Adrenal glands are unremarkable. Kidneys are normal, without renal calculi, focal lesion, or hydronephrosis. Bladder is unremarkable. Stomach/Bowel: Stomach is within normal limits. History of prior appendectomy. Mostly fluid-filled colon. No evidence of bowel wall thickening, distention, or inflammatory changes. Vascular/Lymphatic: Aortic atherosclerosis. No enlarged abdominal or pelvic lymph nodes. Reproductive: Uterus and bilateral adnexa are unremarkable. Other: No abdominal wall hernia or abnormality. No abdominopelvic ascites. No pneumoperitoneum. Musculoskeletal: No acute or significant osseous findings. IMPRESSION: 1. No acute intra-abdominal process. Mostly fluid-filled colon, consistent with diarrheal illness. 2. Aortic Atherosclerosis (ICD10-I70.0). Electronically Signed   By: Obie Dredge M.D.   On: 03/30/2023 10:21    Procedures Procedures    Medications Ordered in ED Medications  lactated ringers bolus 1,000 mL (1,000 mLs Intravenous New Bag/Given 03/30/23 0913)    ED Course/ Medical Decision Making/ A&P                              77 year old female with a history hypertension, hyperlipidemia, IBS who presents with concern for diarrhea and abdominal cramping for 3  weeks.  She has had similar diarrhea in the past that had improved without clear etiology and had a thorough workup done with Dr. Leone Payor gastroenterology.  She had outpatient stool studies which were negative.  It does not appear she has had C. difficile testing done yet, will order that today.  In the setting of significant and continuing diarrhea, fatigue, will order EKG, labs including electrolytes.  She does have abdominal discomfort on exam and history, and will order CT abdomen pelvis without contrast to evaluate for signs of diverticulitis or other abnormalities.  EKG completed and personally abided by me shows a normal sinus rhythm without acute ST changes or significant interval abnormalities.  CT abdomen pelvis shows mostly fluid-filled colon consistent with diarrheal illness.  Labs obtained and personally about interpreted by me are significant for acute kidney injury with a creatinine of 3.13 from previous value of 0.61 in May 2023, BUN elevated to 76.  Her bicarb is decreased to 18, with an anion gap of 16.  Lactic acid added onto labs.  Suspect likely acute kidney injury in the setting of dehydration.  Will admit to the hospitalist for further evaluation.  She was given IV fluids in the emergency department.         Final Clinical Impression(s) / ED Diagnoses Final diagnoses:  AKI (acute kidney injury)  Dehydration    Rx / DC Orders ED Discharge Orders     None         Alvira Monday, MD 03/30/23 2144

## 2023-03-30 NOTE — H&P (Signed)
History and Physical  Connie Gilbert ZOX:[id number] DOB: [birth year] DOA: 03/30/2023  PCP: Merri Brunette, MD   Chief Complaint: Diarrhea  HPI: Connie Gilbert is a 77 y.o. female with medical history significant for hypertension, hyperlipidemia, irritable bowel syndrome being admitted to the hospital with diarrhea and oliguric acute renal failure.  She has known irritable bowel syndrome, had a flare of diarrhea which was watery and daily last summer, she had workup with her gastroenterologist Dr. Leone Payor in the summer 2023 which including colonoscopy and random biopsies did not show any etiology.  She eventually had improvement in her symptoms by the fall and has done well since then.  About 4 weeks ago, she took a course of oral azithromycin and started having some diarrhea at that time.  Unfortunately the diarrhea has only worsened since then.  It is completely watery, no blood or mucus in the stool, no fevers, chills, chest pain.  She had some nausea and vomited once a couple days ago.  She has not felt like eating or drinking anything, and food or drinks do not taste good.  Her primary care started her on some oral Zofran, but otherwise she has not been on any new medications in the last month.  She has hardly eaten anything in the last 48 to 72 hours, has not urinated today, and yesterday just dribbles a few drops of urine.  She has been taking her olmesartan and nebivolol, but not HCTZ.  ED Course: She came to the ER for evaluation, her vital signs were unremarkable.  Lab work shows white blood cell count 4, hemoglobin 12, platelets 143.  Electrolytes are unremarkable, creatinine 3.13, was last 0.7 on 4/9 with outpatient PCP labs.  Lactate is 1.1.  Patient was given normal saline bolus in the ER, and hospitalist admission was requested.  IV maintenance fluids were continued at my request, and repeat creatinine after arrival here is 2.61.  She has still not made any urine.  Review of Systems: Please  see HPI for pertinent positives and negatives. A complete 10 system review of systems are otherwise negative.  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 She has never used smokeless tobacco. She reports current alcohol use. She reports that she does not use drugs.   Allergies  Allergen Reactions   Alendronate Sodium Other (See Comments)   Iodinated Contrast Media Hives and Other (See Comments)    Uncoded Allergy. Allergen: ivp dye   Nitrofurantoin Other (See Comments)    Family History  Problem Relation Age of Onset   Colon polyps Mother    Colon cancer Paternal Aunt    Esophageal cancer Neg Hx    Rectal cancer Neg Hx    Stomach cancer Neg Hx    Breast cancer Neg Hx      Prior to Admission medications   Medication Sig Start Date End Date Taking? Authorizing Provider  Calcium Carb-Cholecalciferol (CALCIUM + D3) 600-200 MG-UNIT TABS Take 600 mg by mouth daily.   Yes [provider]  D 1000 25 MCG (1000 UT) capsule Take 1,000  Units by mouth daily. 02/20/22  Yes [provider]  hydrochlorothiazide (HYDRODIURIL) 25 MG tablet Take 25 mg by mouth daily.   Yes [provider]  hydroxychloroquine (PLAQUENIL) 200 MG tablet Take 200 mg by mouth daily.   Yes [provider]  nebivolol (BYSTOLIC) 5 MG tablet Take 5 mg by mouth daily.   Yes [provider]  olmesartan (BENICAR) 40 MG tablet Take 40 mg by mouth daily.   Yes [provider]  ondansetron (ZOFRAN-ODT) 4 MG disintegrating tablet Take 4 mg by mouth 3 (three) times daily. 03/26/23  Yes [provider]  Turmeric (QC TUMERIC COMPLEX PO) Take 400 mg  by mouth daily.   Yes [provider]  atenolol (TENORMIN) 25 MG tablet Take 25 mg by mouth 2 (two) times daily.    [provider]  atorvastatin (LIPITOR) 20 MG tablet Take 20 mg by mouth daily at 6 PM.  12/21/10   [provider]  chlorthalidone (HYGROTON) 25 MG tablet  02/20/22   [provider]  lisinopril (ZESTRIL) 20 MG tablet  02/20/22   [provider]  loperamide (IMODIUM) 2 MG capsule Take 2 mg by mouth as needed for diarrhea or loose stools.    [provider]  LORazepam (ATIVAN) 1 MG tablet  07/23/09   [provider]  psyllium (REGULOID) 0.52 G capsule Take 0.52 g by mouth daily.    [provider]    Physical Exam: BP 124/75   Pulse 76   Temp (!) 97.5 F (36.4 C) (Axillary)   Resp 15   SpO2 96%   General:  Alert, oriented, calm, in no acute distress  Eyes: EOMI, clear conjuctivae, white sclerea Neck: supple, no masses, trachea mildline  Cardiovascular: RRR, no murmurs or rubs, no peripheral edema  Respiratory: clear to auscultation bilaterally, no wheezes, no crackles  Abdomen: soft, nontender, nondistended, normal bowel tones heard  Skin: dry, no rashes  Musculoskeletal: no joint effusions, normal range of motion  Psychiatric: appropriate affect, normal speech  Neurologic: extraocular muscles intact, clear speech, moving all extremities with intact sensorium          Labs on Admission:  Basic Metabolic Panel: Recent Labs  Lab 03/30/23 0857 03/30/23 1300 03/30/23 1311  NA 131* 135  --   K 3.9 5.0  --   CL 97* 98  --   CO2 18* 20*  --   GLUCOSE 108* 103*  --   BUN 76* 77*  --   CREATININE 3.13* 2.68* 2.61*  CALCIUM 10.0 9.2  --    Liver Function Tests: Recent Labs  Lab 03/30/23 0857  AST 24  ALT 43  ALKPHOS 71  BILITOT 0.6  PROT 7.5  ALBUMIN 4.0   Recent Labs  Lab 03/30/23 0857  LIPASE 31   No results for input(s): "AMMONIA" in the last 168 hours. CBC: Recent Labs  Lab  03/30/23 0857 03/30/23 1311  WBC 5.5 3.6*  NEUTROABS 2.9  --   HGB 14.9 12.6  HCT 44.4 38.5  MCV 93.3 95.1  PLT 217 143*   Cardiac Enzymes: No results for input(s): "CKTOTAL", "CKMB", "CKMBINDEX", "TROPONINI" in the last 168 hours.  BNP (last 3 results) No results for input(s): "BNP" in the last 8760 hours.  ProBNP (last 3 results) No results for input(s): "PROBNP" in the last 8760 hours.  CBG: No results for input(s): "GLUCAP" in the last 168 hours.  Radiological Exams on Admission: CT ABDOMEN PELVIS WO CONTRAST  Result  Date: 03/30/2023 CLINICAL DATA:  Lower abdominal pain with nausea and diarrhea for the past 3 weeks. EXAM: CT ABDOMEN AND PELVIS WITHOUT CONTRAST TECHNIQUE: Multidetector CT imaging of the abdomen and pelvis was performed following the standard protocol without IV contrast. RADIATION DOSE REDUCTION: This exam was performed according to the departmental dose-optimization program which includes automated exposure control, adjustment of the mA and/or kV according to patient size and/or use of iterative reconstruction technique. COMPARISON:  Right upper quadrant ultrasound dated June 11, 2022. FINDINGS: Lower chest: No acute abnormality. Hepatobiliary: No focal liver abnormality is seen. Status post cholecystectomy. Mild dilatation of the common bile duct measuring 11-12 mm, likely due to reservoir effect. No intrahepatic biliary dilatation. Pancreas: Unremarkable. No pancreatic ductal dilatation or surrounding inflammatory changes. Spleen: Normal in size without focal abnormality. Adrenals/Urinary Tract: Adrenal glands are unremarkable. Kidneys are normal, without renal calculi, focal lesion, or hydronephrosis. Bladder is unremarkable. Stomach/Bowel: Stomach is within normal limits. History of prior appendectomy. Mostly fluid-filled colon. No evidence of bowel wall thickening, distention, or inflammatory changes. Vascular/Lymphatic: Aortic atherosclerosis. No enlarged abdominal  or pelvic lymph nodes. Reproductive: Uterus and bilateral adnexa are unremarkable. Other: No abdominal wall hernia or abnormality. No abdominopelvic ascites. No pneumoperitoneum. Musculoskeletal: No acute or significant osseous findings. IMPRESSION: 1. No acute intra-abdominal process. Mostly fluid-filled colon, consistent with diarrheal illness. 2. Aortic Atherosclerosis (ICD10-I70.0). Electronically Signed   By: Obie Dredge M.D.   On: 03/30/2023 10:21    Assessment/Plan This is a pleasant 21 old female with a history of hypertension, hyperlipidemia and IBS being admitted to the hospital with recurrent diarrhea after antibiotic use, dehydration and associated oliguric acute renal failure.  Although the patient has been afebrile, without significant abdominal pain, and has no leukocytosis, we will need to rule out C. difficile. -Observation admission -Continue IV fluids -Avoid nephrotoxins, including holding her home losartan and hydrochlorothiazide -Check stool culture and C. difficile screen, enteric precautions; if negative can start Imodium as needed -While renal function is improving gradually, I am concerned about her oliguria; consulted Dr. Arlean Hopping nephrology   HTN (hypertension)-continue home nebivolol   HLD (hyperlipidemia)   Antibiotic causing adverse effect   Oliguria  DVT prophylaxis: Lovenox     Code Status: Full Code  Consults called: Nephrology Dr. Arlean Hopping  Admission status: Observation  Time spent: 43 minutes  Gae Bihl Sharlette Dense MD [facility] Pager [phone]  If 7PM-7AM, please contact night-coverage [email or web address] Password [email or web address]  03/30/2023, 1:50 PM

## 2023-03-30 NOTE — ED Triage Notes (Signed)
Pt POV from home, caox4, ambulatory, NAD c/o diarrhea and nausea x3 weeks. Pt was seen by her PCP approx 1 wk ago for same but states "it hasn't gotten better." Pt c/o lower abd discomfort as well. Denies vomiting in the past 24 hours.

## 2023-03-30 NOTE — ED Notes (Signed)
Connie Gilbert with cl called for transport 

## 2023-03-30 NOTE — Consult Note (Signed)
Renal Service Consult Note [facility]  Connie Gilbert 03/30/2023 Connie Krabbe, MD Requesting Physician: Dr. Kirby Crigler  Reason for Consult: Renal failure  HPI: The patient is a 77 y.o. year-old w/ PMH as below who presented to ED w/ hx of diarrhea (acute-on-chronic), and nausea w/ some vomiting, also lowered UOP w/ dark urine. She takes hctz and ARB for HTN.  Creat was mid 3's on admission and down to 2's here after IVF's and holding ARB/ hctz. We are asked to see for renal failure.   Pt seen in room. Pt is widowed, has a dtr in [place] w/ 3 kids. Pt lives alone. Hx of IBS for many years, flaring up recently. Has had "full GI work-up" last summer w/ GI team.  Has been on CCB's in the past, also ACEi's.     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 She has never used smokeless tobacco. She reports current alcohol use. She reports that she does not use drugs. Allergies  Allergies  Allergen Reactions   Alendronate Sodium Other (See Comments)   Iodinated Contrast Media Hives and Other (See Comments)    Uncoded Allergy. Allergen: ivp dye   Nitrofurantoin Other (See Comments)   Home medications Prior to Admission medications   Medication  Sig Start Date End Date Taking? Authorizing Provider  atorvastatin (LIPITOR) 20 MG tablet Take 20 mg by mouth daily at 6 PM.  12/21/10  Yes [provider]  Calcium Carb-Cholecalciferol (CALCIUM + D3) 600-200 MG-UNIT TABS Take 600 mg by mouth daily.   Yes [provider]  D 1000 25 MCG (1000 UT) capsule Take 1,000 Units by mouth daily. 02/20/22  Yes [provider]  hydrochlorothiazide (HYDRODIURIL) 25 MG tablet Take 25 mg by mouth daily.   Yes [provider]  hydroxychloroquine (PLAQUENIL) 200 MG tablet Take 200 mg by mouth daily.   Yes [provider]  nebivolol (BYSTOLIC) 5 MG tablet Take 5 mg by mouth daily.   Yes [provider]  olmesartan (BENICAR) 40 MG tablet Take 40 mg by mouth daily.   Yes [provider]  ondansetron (ZOFRAN-ODT) 4 MG disintegrating tablet Take 4 mg by mouth 3 (three) times daily as needed for nausea or vomiting. 03/26/23  Yes [provider]  psyllium (METAMUCIL) 58.6 % packet Take 1 packet by mouth daily.   Yes [provider]  Turmeric (QC TUMERIC COMPLEX PO) Take 400 mg by mouth daily.   Yes [provider]     Vitals:   03/30/23 1100 03/30/23 1130 03/30/23  1351 03/30/23 1425  BP: 123/61 124/75  112/67  Pulse: 72 76 66 74  Resp:    14  Temp:   (!) 97 F (36.1 C) (!) 97.2 F (36.2 C)  TempSrc:    Axillary  SpO2: 99% 96% 100% 100%   Exam Gen alert, no distress No rash, cyanosis or gangrene Sclera anicteric, throat clear  No jvd or bruits Chest clear bilat to bases, no rales/ wheezing RRR no MRG Abd soft ntnd no mass or ascites +bs GU defer MS no joint effusions or deformity Ext no LE or UE edema, no wounds or ulcers Neuro is alert, Ox 3 , nf    Home meds include - plaquenil, bystolic 5 qd, lipitor, hctz, olmesartan 40 qd, prns    UA, UNa, UCr pending   CT abd/pelv 4/15 showed --> normal appearing bilat kidney's w/o obstruction   Labs from 1 month ago (PCP,  outpatient) showed Creat 0.78    Assessment/ Plan: AKI -  due to combination of ACEi, hctz and acute dehydration due to diarrhea w/ some N/V in pt w/ long of HTN and IBS.  BP's were soft on admit, got 1 L bolus in ED and IVF"s overnight. Pt feeling better, Bp's up and per the pt urine color is normalizing. Will rebolus 2.5 L and cont IVF's overnight. Would cont to hold diuretics and ARB/ ACEi's for this patient until/ unless her IBS comes under control.  Alternatives would be CCB's, BB's , hydralazine, cardura, clonidine. Get labs in am. Would doubt that there will be any permanent renal damage.  HTN - as above Diarrhea - acute on chronic, per pmd   Vinson Moselle  MD [facility] 03/30/2023, 3:57 PM  Recent Labs  Lab 03/30/23 0857 03/30/23 1300 03/30/23 1311  HGB 14.9  --  12.6  ALBUMIN 4.0  --   --   CALCIUM 10.0 9.2  --   CREATININE 3.13* 2.68* 2.61*  K 3.9 5.0  --    Inpatient medications:  amLODipine  5 mg Oral Daily   aspirin EC  81 mg Oral Daily   atorvastatin  20 mg Oral q1800   enoxaparin (LOVENOX) injection  30 mg Subcutaneous Q24H   [START ON 03/31/2023] nebivolol  5 mg Oral Daily   psyllium  1 packet Oral Daily    sodium chloride     sodium chloride     acetaminophen **OR** acetaminophen, metoprolol tartrate, ondansetron **OR** ondansetron (ZOFRAN) IV, mouth rinse

## 2023-03-31 DIAGNOSIS — E872 Acidosis, unspecified: Secondary | ICD-10-CM | POA: Diagnosis present

## 2023-03-31 DIAGNOSIS — Z7982 Long term (current) use of aspirin: Secondary | ICD-10-CM | POA: Diagnosis not present

## 2023-03-31 DIAGNOSIS — N179 Acute kidney failure, unspecified: Secondary | ICD-10-CM | POA: Diagnosis present

## 2023-03-31 DIAGNOSIS — M1990 Unspecified osteoarthritis, unspecified site: Secondary | ICD-10-CM | POA: Diagnosis present

## 2023-03-31 DIAGNOSIS — K580 Irritable bowel syndrome with diarrhea: Secondary | ICD-10-CM | POA: Diagnosis present

## 2023-03-31 DIAGNOSIS — Z800 Family history of malignant neoplasm of digestive organs: Secondary | ICD-10-CM | POA: Diagnosis not present

## 2023-03-31 DIAGNOSIS — E860 Dehydration: Secondary | ICD-10-CM | POA: Diagnosis present

## 2023-03-31 DIAGNOSIS — R34 Anuria and oliguria: Secondary | ICD-10-CM | POA: Diagnosis present

## 2023-03-31 DIAGNOSIS — L949 Localized connective tissue disorder, unspecified: Secondary | ICD-10-CM

## 2023-03-31 DIAGNOSIS — Z79899 Other long term (current) drug therapy: Secondary | ICD-10-CM | POA: Diagnosis not present

## 2023-03-31 DIAGNOSIS — D696 Thrombocytopenia, unspecified: Secondary | ICD-10-CM | POA: Diagnosis not present

## 2023-03-31 DIAGNOSIS — Z87891 Personal history of nicotine dependence: Secondary | ICD-10-CM | POA: Diagnosis not present

## 2023-03-31 DIAGNOSIS — I10 Essential (primary) hypertension: Secondary | ICD-10-CM | POA: Diagnosis present

## 2023-03-31 DIAGNOSIS — R768 Other specified abnormal immunological findings in serum: Secondary | ICD-10-CM | POA: Diagnosis present

## 2023-03-31 DIAGNOSIS — Z602 Problems related to living alone: Secondary | ICD-10-CM | POA: Diagnosis present

## 2023-03-31 DIAGNOSIS — Z91041 Radiographic dye allergy status: Secondary | ICD-10-CM | POA: Diagnosis not present

## 2023-03-31 DIAGNOSIS — R197 Diarrhea, unspecified: Secondary | ICD-10-CM | POA: Diagnosis not present

## 2023-03-31 DIAGNOSIS — E876 Hypokalemia: Secondary | ICD-10-CM | POA: Diagnosis present

## 2023-03-31 DIAGNOSIS — E785 Hyperlipidemia, unspecified: Secondary | ICD-10-CM | POA: Diagnosis present

## 2023-03-31 DIAGNOSIS — K649 Unspecified hemorrhoids: Secondary | ICD-10-CM | POA: Diagnosis present

## 2023-03-31 DIAGNOSIS — Z83719 Family history of colon polyps, unspecified: Secondary | ICD-10-CM | POA: Diagnosis not present

## 2023-03-31 DIAGNOSIS — E440 Moderate protein-calorie malnutrition: Secondary | ICD-10-CM | POA: Diagnosis present

## 2023-03-31 DIAGNOSIS — B370 Candidal stomatitis: Secondary | ICD-10-CM | POA: Diagnosis present

## 2023-03-31 DIAGNOSIS — Z888 Allergy status to other drugs, medicaments and biological substances status: Secondary | ICD-10-CM | POA: Diagnosis not present

## 2023-03-31 DIAGNOSIS — R112 Nausea with vomiting, unspecified: Secondary | ICD-10-CM | POA: Diagnosis present

## 2023-03-31 LAB — C-REACTIVE PROTEIN: CRP: 6 mg/dL — ABNORMAL HIGH (ref <1.0)

## 2023-03-31 LAB — GASTROINTESTINAL PANEL BY PCR, STOOL (REPLACES STOOL CULTURE)

## 2023-03-31 LAB — COMPREHENSIVE METABOLIC PANEL
ALT: 28 U/L (ref 0–44)
AST: 17 U/L (ref 15–41)
Albumin: 2.4 g/dL — ABNORMAL LOW (ref 3.5–5.0)
Alkaline Phosphatase: 51 U/L (ref 38–126)
Anion gap: 10 (ref 5–15)
BUN: 45 mg/dL — ABNORMAL HIGH (ref 8–23)
CO2: 13 mmol/L — ABNORMAL LOW (ref 22–32)
Calcium: 7.7 mg/dL — ABNORMAL LOW (ref 8.9–10.3)
Chloride: 114 mmol/L — ABNORMAL HIGH (ref 98–111)
Creatinine, Ser: 1.2 mg/dL — ABNORMAL HIGH (ref 0.44–1.00)
GFR, Estimated: 47 mL/min — ABNORMAL LOW (ref >60)
Glucose, Bld: 77 mg/dL (ref 70–99)
Potassium: 3.9 mmol/L (ref 3.5–5.1)
Sodium: 137 mmol/L (ref 135–145)
Total Bilirubin: 0.9 mg/dL (ref 0.3–1.2)
Total Protein: 5.3 g/dL — ABNORMAL LOW (ref 6.5–8.1)

## 2023-03-31 LAB — URINALYSIS, ROUTINE W REFLEX MICROSCOPIC
Bacteria, UA: NONE SEEN
Bilirubin Urine: NEGATIVE
Glucose, UA: NEGATIVE mg/dL
Ketones, ur: 20 mg/dL — AB
Leukocytes,Ua: NEGATIVE
Nitrite: NEGATIVE
Protein, ur: NEGATIVE mg/dL
Specific Gravity, Urine: 1.015 (ref 1.005–1.030)
pH: 5 (ref 5.0–8.0)

## 2023-03-31 LAB — CBC
HCT: 38 % (ref 36.0–46.0)
Hemoglobin: 12 g/dL (ref 12.0–15.0)
MCH: 31.2 pg (ref 26.0–34.0)
MCHC: 31.6 g/dL (ref 30.0–36.0)
MCV: 98.7 fL (ref 80.0–100.0)
Platelets: 121 10*3/uL — ABNORMAL LOW (ref 150–400)
RBC: 3.85 MIL/uL — ABNORMAL LOW (ref 3.87–5.11)
RDW: 14.3 % (ref 11.5–15.5)
WBC: 3.1 10*3/uL — ABNORMAL LOW (ref 4.0–10.5)
nRBC: 0 % (ref 0.0–0.2)

## 2023-03-31 LAB — SEDIMENTATION RATE: Sed Rate: 30 mm/hr — ABNORMAL HIGH (ref 0–22)

## 2023-03-31 MED ORDER — LOPERAMIDE HCL 2 MG PO CAPS: 2.0000 mg | ORAL_CAPSULE | ORAL | Status: DC | PRN

## 2023-03-31 MED ORDER — ENOXAPARIN SODIUM 40 MG/0.4ML IJ SOSY
40.0000 mg | PREFILLED_SYRINGE | INTRAMUSCULAR | Status: DC
  Administered 2023-04-01: 40 mg via SUBCUTANEOUS
  Filled 2023-03-31: qty 0.4

## 2023-03-31 MED ORDER — SODIUM CHLORIDE 0.9 % IV SOLN: INTRAVENOUS | Status: AC

## 2023-03-31 MED ORDER — SACCHAROMYCES BOULARDII 250 MG PO CAPS
250.0000 mg | ORAL_CAPSULE | Freq: Two times a day (BID) | ORAL | Status: DC
  Administered 2023-03-31 – 2023-04-02 (×4): 250 mg via ORAL
  Filled 2023-03-31 (×4): qty 1

## 2023-03-31 MED ORDER — NYSTATIN 100000 UNIT/ML MT SUSP
5.0000 mL | Freq: Four times a day (QID) | OROMUCOSAL | Status: DC
  Administered 2023-03-31 – 2023-04-02 (×7): 500000 [IU] via ORAL
  Filled 2023-03-31 (×7): qty 5

## 2023-03-31 NOTE — Progress Notes (Signed)
[facility] Progress Note  Subjective: seen in room, creat down 1.2 today.   Vitals:   03/30/23 1726 03/30/23 2102 03/31/23 0130 03/31/23 0554  BP: 128/66 120/62 125/78 121/72  Pulse: 76 85 86 78  Resp: Temp: (!) 97 F (36.1 C) 98.1 F (36.7 C) 98 F (36.7 C) 98.2 F (36.8 C)  TempSrc: Oral Oral Oral   SpO2: 100% 100% 98% 98%  Weight:  57.2 kg    Height:   (1.549 m)      Exam: Gen alert, no distress No jvd or bruits Chest clear bilat to bases RRR no MRG Abd soft ntnd no mass or ascites +bs MS no joint effusions or deformity Ext no LE edema Neuro is alert, Ox 3 , nf      Home meds include - plaquenil, bystolic 5 qd, lipitor, hctz, olmesartan 40 qd, prns     UA, UNa, UCr pending   CT abd/pelv 4/15 showed --> normal appearing bilat kidney's w/o obstruction   Labs from 1 month ago (PCP, outpatient) showed Creat 0.78       Assessment/ Plan: AKI -  b/l creatinine 0.78 1 month ago as OP. Creat on admit here was 3.1 in the setting ACEi/ hctz exposure and acute dehydration due to recurrent diarrhea issues/ IBS.  BP's were soft on admit and was given 1 L bolus in ED and IVF"s were continued. Pt felt better and creat improved from 3.1 to 2.6 yesterday. We rebolus's 2.5 L and continued IVF's overnight and today creat is down to 1.20. AKI mostly resolved. Would avoid diuretics/ ARB/ ACEi's for this patient's HTN unless her IBS comes under control.  Alternatives would be CCB's, BB's , hydralazine, cardura, clonidine. No further suggestions, will sign off. Does not need renal f/u.  HTN - as above Diarrhea / hx IBS - acute on chronic, per pmd   Vinson Moselle MD [facility] 03/31/2023, 1:28 PM  Recent Labs  Lab 03/30/23 0857 03/30/23 1300 03/30/23 1311 03/31/23 0552  HGB 14.9  --  12.6 12.0  ALBUMIN 4.0  --   --  2.4*  CALCIUM 10.0 9.2  --  7.7*  CREATININE 3.13* 2.68* 2.61* 1.20*  K 3.9 5.0  --  3.9   No results for input(s): "IRON", "TIBC",  "FERRITIN" in the last 168 hours. Inpatient medications:  amLODipine  5 mg Oral Daily   aspirin EC  81 mg Oral Daily   atorvastatin  20 mg Oral q1800   enoxaparin (LOVENOX) injection  40 mg Subcutaneous Q24H   nebivolol  5 mg Oral Daily   psyllium  1 packet Oral Daily    sodium chloride 75 mL/hr at 03/31/23 0909   acetaminophen **OR** acetaminophen, loperamide, metoprolol tartrate, ondansetron **OR** ondansetron (ZOFRAN) IV, mouth rinse, polyvinyl alcohol

## 2023-03-31 NOTE — Consult Note (Addendum)
Consultation  Referring Provider:  [facility]/ [place] Primary Care Physician:  Merri Brunette, MD Primary Gastroenterologist:  Dr.Gessner  Reason for Consultation:   Diarrhea x 3-4 weeks  HPI: Connie Gilbert is a 77 y.o. female, established with Dr. Leone Payor, who had been seen in our office last spring after she had had a prolonged episode of diarrhea.  She had stool studies at that time which were unremarkable and underwent colonoscopy in May 2023 which was a normal exam with the exception of hemorrhoids.  Random biopsies were done to rule out microscopic colitis and those were negative there was some mild nonspecific inflammation noted to be more consistent with prep related changes and no features of IBD or microscopic colitis. She says eventually the diarrhea resolved and she had not been having any problems with diarrhea over the past 8 months.  She reports having normal bowel movements and no ongoing IBS type symptoms or abdominal pain. About a month ago she developed a sinus infection and was given azithromycin.  She did develop diarrhea shortly after starting the antibiotic, and says she usually gets diarrhea with antibiotics but it stops after finishing the course.  At this point the diarrhea has persisted over the past 3 weeks.  She says she is usually having 5-7 watery bowel movements per day, usually large-volume early in the morning and smaller amounts later in the day, no melena or hematochezia.  No ongoing abdominal pain, has had mild cramping urgency prior to bowel movements.  No fever or chills.  After the first week she also developed nausea and decrease in appetite and intermittent episodes of vomiting.  She says over the past couple of weeks she really has not been eating well at all. She had been seen by her PCP last week, given antiemetic which she was not using regularly.  Over this past weekend she developed progressive weakness and came to the emergency room for admission  yesterday. Labs on admit showed WBC of 4, hemoglobin 12 , BUN 76/creatinine of 3.13, sodium 131/potassium 3.9 Lactate within normal limits  She had continued taking her blood pressure medications including HydroDIURIL and olmesartan .  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  She says she started that in January and was supposed to be on 1-1/2 tablets daily but has only been taking 1 tablet daily because she read about potential side effects. She also relates that she is enrolled in a study as she is high risk for myocardial infarction.  She took her first injection of this drug in January.  She does have a card with her and this is a Victoran-Prevent study involving the drug inclisiran which is already on the market for hyperlipidemia.  Plan is for an injection every 6 months..  Labs today C. difficile quick screen negative, GI path panel and culture pending WBC 3.1/hemoglobin 12/hematocrit 38/platelets 121 Sodium 137/potassium 3.9 BUN 45/creatinine 1.2 LFTs within normal limits  Did have noncontrasted CT of the abdomen pelvis done yesterday Showing her to be status postcholecystectomy, mild dilation of the common bile duct measuring 11 to 12 mm likely postcholecystectomy effect on a normal-appearing pancreas, and mostly fluid-filled colon noted consistent with diarrheal illness.  She says that her mouth feels dry and that things taste bad, she is trying to eat a full liquid diet today nausea improved with antiemetics still a bit queasy but trying to force herself to eat.  Feels better  today with IV fluid hydration. She had been using Imodium at home which she says does slow down the diarrhea but unfortunately makes her feel bloated uncomfortable and with a lot of gurgling in her abdomen, and says she actually feels better if she is able to have a diarrheal stool, then how she feels on Imodium.  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 Authorizing Provider  atorvastatin (LIPITOR) 20 MG tablet Take 20 mg by mouth daily at 6 PM.  12/21/10  Yes [provider]  Calcium Carb-Cholecalciferol (CALCIUM + D3) 600-200 MG-UNIT TABS Take 600 mg by mouth daily.   Yes [provider]  D 1000 25 MCG (1000 UT) capsule Take 1,000 Units by mouth daily. 02/20/22  Yes [provider]  hydrochlorothiazide (HYDRODIURIL) 25 MG tablet Take 25 mg by mouth daily.   Yes [provider]  hydroxychloroquine (PLAQUENIL) 200 MG tablet Take 200 mg by mouth daily.   Yes [provider]  nebivolol (BYSTOLIC) 5 MG tablet Take 5 mg by mouth daily.   Yes [provider]  olmesartan (BENICAR) 40 MG tablet Take 40 mg by mouth daily.   Yes [provider]  ondansetron (ZOFRAN-ODT) 4 MG disintegrating tablet Take 4 mg by mouth 3 (three) times daily as needed for nausea or vomiting. 03/26/23  Yes [provider]  psyllium (METAMUCIL) 58.6 % packet Take 1 packet by mouth daily.   Yes [provider]  Turmeric (QC TUMERIC COMPLEX PO) Take 400 mg by mouth daily.   Yes [provider]    Current Facility-Administered Medications  Medication Dose Route Frequency Provider Last Rate Last Admin   0.9 %  sodium chloride infusion   Intravenous Continuous [place], Eric J, DO 75 mL/hr at 03/31/23 0909 New Bag at 03/31/23 0909   acetaminophen (TYLENOL) tablet 650 mg  650 mg Oral Q6H PRN Maryln Gottron, MD       Or   acetaminophen (TYLENOL) suppository 650 mg  650 mg Rectal Q6H PRN Kirby Crigler, Mir M, MD        amLODipine (NORVASC) tablet 5 mg  5 mg Oral Daily Delano Metz, MD   5 mg at 03/31/23 1035   aspirin EC tablet 81 mg  81 mg Oral Daily Kirby Crigler, Mir M, MD       atorvastatin (LIPITOR) tablet 20 mg  20 mg Oral q1800 Kirby Crigler, Mir M, MD   20 mg at 03/30/23 1825   enoxaparin (LOVENOX) injection 40 mg  40 mg Subcutaneous Q24H [place], Abby K, RPH       loperamide (IMODIUM) capsule 2 mg  2 mg Oral PRN [place], Eric J, DO       metoprolol tartrate (LOPRESSOR) injection 5 mg  5 mg Intravenous Q6H PRN Kirby Crigler, Mir M, MD       nebivolol (BYSTOLIC) tablet 5 mg  5 mg Oral Daily Kirby Crigler, Mir M, MD   5 mg at 03/31/23 1034   ondansetron (ZOFRAN) tablet 4 mg  4 mg Oral Q6H PRN Maryln Gottron, MD  4 mg at 03/30/23 1527   Or   ondansetron (ZOFRAN) injection 4 mg  4 mg Intravenous Q6H PRN Maryln Gottron, MD       Oral care mouth rinse  15 mL Mouth Rinse PRN Kirby Crigler, Mir M, MD       polyvinyl alcohol (LIQUIFILM TEARS) 1.4 % ophthalmic solution 2 drop  2 drop Both Eyes PRN Kirby Crigler, Mir M, MD   2 drop at 03/31/23 1251   psyllium (HYDROCIL/METAMUCIL) 1 packet  1 packet Oral Daily Herby Abraham, RPH   1 packet at 03/31/23 1034    Allergies as of 03/30/2023 - Review Complete 03/30/2023  Allergen Reaction Noted   Alendronate sodium Other (See Comments) 10/28/2021   Iodinated contrast media Hives and Other (See Comments) 12/11/2014   Nitrofurantoin Other (See Comments) 10/28/2021    Family History  Problem Relation Age of Onset   Colon polyps Mother    Colon cancer Paternal Aunt    Esophageal cancer Neg Hx    Rectal cancer Neg Hx    Stomach cancer Neg Hx    Breast cancer Neg Hx     Social History   Socioeconomic History   Marital status: Widowed    Spouse name: Not on file   Number of children: 1   Years of education: Not on file   Highest education level: Not on file  Occupational History   Not on file  Tobacco Use   Smoking status: Former   Smokeless tobacco: Never   Vaping Use   Vaping Use: Never used  Substance and Sexual Activity   Alcohol use: Yes    Alcohol/week: 0.0 standard drinks of alcohol    Comment: socially   Drug use: No   Sexual activity: Yes  Other Topics Concern   Not on file  Social History Narrative   Not on file   Social Determinants of Health   Financial Resource Strain: Not on file  Food Insecurity: No Food Insecurity (03/30/2023)   Hunger Vital Sign    Worried About Running Out of Food in the Last Year: Never true    Ran Out of Food in the Last Year: Never true  Transportation Needs: No Transportation Needs (03/30/2023)   PRAPARE - Administrator, Civil Service (Medical): No    Lack of Transportation (Non-Medical): No  Physical Activity: Not on file  Stress: Not on file  Social Connections: Not on file  Intimate Partner Violence: Not At Risk (03/30/2023)   Humiliation, Afraid, Rape, and Kick questionnaire    Fear of Current or Ex-Partner: No    Emotionally Abused: No    Physically Abused: No    Sexually Abused: No    Review of Systems: Pertinent positive and negative review of systems were noted in the above HPI section.  All other review of systems was otherwise negative.   Physical Exam: Vital signs in last 24 hours: Temp:  [97 F (36.1 C)-98.2 F (36.8 C)] 98.2 F (36.8 C) (04/16 0554) Pulse Rate:  [66-86] 78 (04/16 0554) Resp:  [14-16] 16 (04/16 0554) BP: (112-128)/(62-78) 121/72 (04/16 0554) SpO2:  [98 %-100 %] 98 % (04/16 0554) Weight:  [57.2 kg] 57.2 kg (04/15 2102) Last BM Date : 03/30/23 General:   Alert,  Well-developed, well-nourished, thin elderly white female pleasant and cooperative in NAD Head:  Normocephalic and atraumatic. Eyes:  Sclera clear, no icterus.   Conjunctiva pink. Ears:  Normal auditory acuity. Nose:  No deformity, discharge,  or lesions.  Mouth:  No deformity or lesions.  No obvious thrush, tongue slightly greenish coating Neck:  Supple; no masses or  thyromegaly. Lungs:  Clear throughout to auscultation.   No wheezes, crackles, or rhonchi.  Heart:  Regular rate and rhythm; no murmurs, clicks, rubs,  or gallops. Abdomen:  Soft,nontender, BS active,nonpalp mass or hsm.   Rectal: Not done Msk:  Symmetrical without gross deformities. . Pulses:  Normal pulses noted. Extremities:  Without clubbing or edema. Neurologic:  Alert and  oriented x4;  grossly normal neurologically. Skin:  Intact without significant lesions or rashes.. Psych:  Alert and cooperative. Normal mood and affect.  Intake/Output from previous day: 04/15 0701 - 04/16 0700 In: 2400.2 [P.O.:240; I.V.:960.2; IV Piggyback:1200] Out: -  Intake/Output this shift: No intake/output data recorded.  Lab Results: Recent Labs    03/30/23 0857 03/30/23 1311 03/31/23 0552  WBC 5.5 3.6* 3.1*  HGB 14.9 12.6 12.0  HCT 44.4 38.5 38.0  PLT 217 143* 121*   BMET Recent Labs    03/30/23 0857 03/30/23 1300 03/30/23 1311 03/31/23 0552  NA 131* 135  --  137  K 3.9 5.0  --  3.9  CL 97* 98  --  114*  CO2 18* 20*  --  13*  GLUCOSE 108* 103*  --  77  BUN 76* 77*  --  45*  CREATININE 3.13* 2.68* 2.61* 1.20*  CALCIUM 10.0 9.2  --  7.7*   LFT Recent Labs    03/31/23 0552  PROT 5.3*  ALBUMIN 2.4*  AST 17  ALT 28  ALKPHOS 51  BILITOT 0.9   PT/INR No results for input(s): "LABPROT", "INR" in the last 72 hours. Hepatitis Panel No results for input(s): "HEPBSAG", "HCVAB", "HEPAIGM", "HEPBIGM" in the last 72 hours.   IMPRESSION:   #59 77 year old white female with 3 to 4-week history of persistent diarrhea with 5-7 bowel movements per day/watery stool, and 2 to 3-week history of nausea and poor appetite sporadic episodes of vomiting at home Onset of symptoms with taking azithromycin for sinus infection  Patient has prior history of a prolonged diarrheal episode spring 2023, with negative workup at our office including colonoscopy which was unremarkable and random biopsies  not showing any features consistent with IBD or microscopic colitis. That episode resolved and she had not had any problems until onset of current symptoms  C. difficile quick screen is negative GI path panel and culture pending  Etiology of the current diarrheal episode is not clear this may be antibiotic related diarrhea though unusual not to have any improvement 3 weeks after stopping the antibiotic. Interestingly she is on a study drug called inclisiran which is on the market for hyperlipidemia-this drug definitely lists side effects of diarrhea and needs to be considered as culprit She had also started taking Plaquenil in January which can have GI side effects of nausea vomiting abdominal pain diarrhea-again but tolerated for 2 months prior to onset of symptoms  #2 acute kidney injury secondary to above-and in combination with continued use of olmesartan and HCTZ-significant improvement since admission #3 new diagnosis of unspecified connective tissue disorder #4 hypertension #5.  Hyperlipidemia #6.  Status post remote appendectomy and cholecystectomy  Plan; await pending stool studies, Added calprotectin and fecal elastase Will start Florastor 1 p.o. twice daily  I do not see a need to push Imodium Will start Mycostatin oral suspension 5 cc swish and spit 4 times daily for possible mild oral candidiasis Bland diet as tolerates If stool studies  are all negative, may need to hold Plaquenil, and have her stop the study drug inclisiran GI will follow-up in a.m.  Amy Esterwood PA-C 03/31/2023, 12:55 PM   GI ATTENDING  Thoroughly reviewed all aspects of the patient's history, exam and myriad of data. Agree with the above consultative note without additions or deletions. Plans as outlined. Will follow with you. Thanks.   Wilhemina Bonito. Eda Keys., M.D. [facility] Division of Gastroenterology

## 2023-03-31 NOTE — Progress Notes (Signed)
PROGRESS NOTE    Connie Gilbert  QMV:[id number] DOB: 07/23/1946 DOA: 03/30/2023 PCP: Merri Brunette, MD    Brief Narrative:   Connie Gilbert is a 77 y.o. female with past medical history significant for HTN, HLD, IBS who presented to [facility] ED on 4/15 from home with complaint of nausea and diarrhea x 3 weeks.  Additionally complains of lower abdominal discomfort.  Denies vomiting.  Was seen by her PCP approximately 1 week ago for same but symptoms have not improved with oral Zofran.  During this timeframe patient reports poor oral intake.  Patient also endorses decreased urination and has been taking her olmesartan, nebivolol but not HCTZ.  She reports about 4 weeks prior she took a course of oral azithromycin and when she started having some diarrhea at that time.  Diarrhea is completely watery, no blood or mucus in the stool.  Denies fever/chills.  In the ED, amateur 97.5 F, HR 74, RR 20, BP 121/65, SpO2 98% on room air.  WBC 5.5, hemoglobin 14.9, platelets 217.  Sodium 131, potassium 3.9, chloride 97, CO2 18, glucose 108, BUN 76, creatinine 3.13.  AST 24, ALT 43, total bilirubin 0.6.  Lipase 31.  Lactic acid 1.1.  CT abdomen/pelvis with no acute intra-abdominal process, mostly fluid-filled colon consistent with diarrheal illness.  Patient was given 1 L IV fluid bolus in the ED.  Nephrology was consulted.  [facility] consulted for admission for further evaluation management of acute renal failure likely secondary to diarrheal illness, poor oral intake in the setting of ARB/HCTZ use.  Assessment & Plan:   Acute renal failure: Improving Patient presenting with diarrhea x 1 week, watery with no mucus/blood.  Baseline creatinine 0.7.  Creatinine on admission 3.13.  CT abdomen/pelvis with kidneys notable for normal in appearance without lesion, hydronephrosis or calculi.  Etiology likely secondary to prerenal azotemia in the setting of volume loss from diarrhea. -- Cr 3.13>>1.20 -- continue NS  at 75 mL/h -- Holding home ARB/HCTZ -- BMP daily  Diarrhea History of irritable bowel syndrome Follows with gastroenterology outpatient, Dr. Leone Payor.  Patient underwent extensive workup May 2023 with pancreatic elastase, calprotectin, lactoferrin, GI PCR, C. difficile, ova and parasite which were all negative.  Underwent colonoscopy with pathology from biopsy showing mild nonspecific inflammation with no diagnostic features of inflammatory bowel disease, microscopic colitis or collagenous colitis. -- [facility] consulted -- C. difficile negative -- GI PCR panel: Pending -- Stool culture: Pending -- Continue enteric precautions for now  Essential hypertension Home regimen includes olmesartan 40 mg p.o. daily, hydrochlorothiazide 25 mg p.o. daily, nebivolol 5 mg p.o. daily. -- Started on amlodipine 5 mg p.o. daily -- Continue nebivolol 5 mg p.o. daily -- Holding olmesartan/HCTZ  Hyperlipidemia -- Continue atorvastatin 20 mg p.o. 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  On hydroxychloroquine.   DVT prophylaxis: enoxaparin (LOVENOX) injection 40 mg Start: 03/31/23 1800 SCDs Start: 03/30/23 1259    Code Status: Full Code Family Communication: No family present at bedside this morning  Disposition Plan:  Level of care: Med-Surg Status is: Observation The patient remains OBS appropriate and will d/c before 2 midnights.    Consultants:  Nephrology Gastroenterology  Procedures:  None  Antimicrobials:  None   Subjective: Patient seen examined at bedside, lying in bed.  Continues to report several episodes of diarrhea.  Renal function improved from 3.13 on admission to 1.20 this morning.  Remains on IV fluids.  States Imodium is  not helpful.  Asks about wanting to try cholestyramine, reports this has not been effective in the past as well.  Requesting to talk with GI.  Further states "I have many medical conditions that no one  knows why I have", "I have high blood pressure and they put me on the newest medications when they come out".  Also reports was recently started on Plaquenil by rheumatology for connective tissue disease after she was diagnosed with this following pain in her hands after she was trimming many bushes in her yard.  No other specific complaints or concerns at this time.  Denies headache, no dizziness, no chest pain, no palpitations, no shortness of breath, no current abdominal pain, no fever/chills/night sweats.  No acute events overnight per nursing staff.  Objective: Vitals:   03/30/23 1726 03/30/23 2102 03/31/23 0130 03/31/23 0554  BP: 128/66 120/62 125/78 121/72  Pulse: 76 85 86 78  Resp: 14 16 16 16   Temp: (!) 97 F (36.1 C) 98.1 F (36.7 C) 98 F (36.7 C) 98.2 F (36.8 C)  TempSrc: Oral Oral Oral   SpO2: 100% 100% 98% 98%  Weight:  57.2 kg    Height:  5\' 1"  (1.549 m)      Intake/Output Summary (Last 24 hours) at 03/31/2023 1159 Last data filed at 03/31/2023 0200 Gross per 24 hour  Intake 1400.2 ml  Output --  Net 1400.2 ml   Filed Weights   03/30/23 2102  Weight: 57.2 kg    Examination:  Physical Exam: GEN: NAD, alert and oriented x 3, elderly in appearance HEENT: NCAT, PERRL, EOMI, sclera clear, MMM PULM: CTAB w/o wheezes/crackles, normal respiratory effort, on room air CV: RRR w/o M/G/R GI: abd soft, NTND, NABS, no R/G/M MSK: no peripheral edema, moves all extremities independently NEURO: No focal neurological deficits PSYCH: normal mood/affect Integumentary: No appreciable rashes/wounds/lesions noted on exposed skin surfaces.    Data Reviewed: I have personally reviewed following labs and imaging studies  CBC: Recent Labs  Lab 03/30/23 0857 03/30/23 1311 03/31/23 0552  WBC 5.5 3.6* 3.1*  NEUTROABS 2.9  --   --   HGB 14.9 12.6 12.0  HCT 44.4 38.5 38.0  MCV 93.3 95.1 98.7  PLT 217 143* 121*   Basic Metabolic Panel: Recent Labs  Lab 03/30/23 0857  03/30/23 1300 03/30/23 1311 03/31/23 0552  NA 131* 135  --  137  K 3.9 5.0  --  3.9  CL 97* 98  --  114*  CO2 18* 20*  --  13*  GLUCOSE 108* 103*  --  77  BUN 76* 77*  --  45*  CREATININE 3.13* 2.68* 2.61* 1.20*  CALCIUM 10.0 9.2  --  7.7*   GFR: Estimated Creatinine Clearance: 30.6 mL/min (A) (by C-G formula based on SCr of 1.2 mg/dL (H)). Liver Function Tests: Recent Labs  Lab 03/30/23 0857 03/31/23 0552  AST 24 17  ALT 43 28  ALKPHOS 71 51  BILITOT 0.6 0.9  PROT 7.5 5.3*  ALBUMIN 4.0 2.4*   Recent Labs  Lab 03/30/23 0857  LIPASE 31   No results for input(s): "AMMONIA" in the last 168 hours. Coagulation Profile: No results for input(s): "INR", "PROTIME" in the last 168 hours. Cardiac Enzymes: No results for input(s): "CKTOTAL", "CKMB", "CKMBINDEX", "TROPONINI" in the last 168 hours. BNP (last 3 results) No results for input(s): "PROBNP" in the last 8760 hours. HbA1C: No results for input(s): "HGBA1C" in the last 72 hours. CBG: No results for input(s): "GLUCAP"  in the last 168 hours. Lipid Profile: No results for input(s): "CHOL", "HDL", "LDLCALC", "TRIG", "CHOLHDL", "LDLDIRECT" in the last 72 hours. Thyroid Function Tests: No results for input(s): "TSH", "T4TOTAL", "FREET4", "T3FREE", "THYROIDAB" in the last 72 hours. Anemia Panel: No results for input(s): "VITAMINB12", "FOLATE", "FERRITIN", "TIBC", "IRON", "RETICCTPCT" in the last 72 hours. Sepsis Labs: Recent Labs  Lab 03/30/23 1134 03/30/23 1300  LATICACIDVEN 1.1 0.9    Recent Results (from the past 240 hour(s))  C Difficile Quick Screen w PCR reflex     Status: None   Collection Time: 03/30/23  3:36 PM   Specimen: STOOL  Result Value Ref Range Status   C Diff antigen NEGATIVE NEGATIVE Final   C Diff toxin NEGATIVE NEGATIVE Final   C Diff interpretation No C. difficile detected.  Final    Comment: Performed at [facility], [address] [place], [place] [address]          Radiology Studies: CT ABDOMEN PELVIS WO CONTRAST  Result Date: 03/30/2023 CLINICAL DATA:  Lower abdominal pain with nausea and diarrhea for the past 3 weeks. EXAM: CT ABDOMEN AND PELVIS WITHOUT CONTRAST TECHNIQUE: Multidetector CT imaging of the abdomen and pelvis was performed following the standard protocol without IV contrast. RADIATION DOSE REDUCTION: This exam was performed according to the departmental dose-optimization program which includes automated exposure control, adjustment of the mA and/or kV according to patient size and/or use of iterative reconstruction technique. COMPARISON:  Right upper quadrant ultrasound dated June 11, 2022. FINDINGS: Lower chest: No acute abnormality. Hepatobiliary: No focal liver abnormality is seen. Status post cholecystectomy. Mild dilatation of the common bile duct measuring 11-12 mm, likely due to reservoir effect. No intrahepatic biliary dilatation. Pancreas: Unremarkable. No pancreatic ductal dilatation or surrounding inflammatory changes. Spleen: Normal in size without focal abnormality. Adrenals/Urinary Tract: Adrenal glands are unremarkable. Kidneys are normal, without renal calculi, focal lesion, or hydronephrosis. Bladder is unremarkable. Stomach/Bowel: Stomach is within normal limits. History of prior appendectomy. Mostly fluid-filled colon. No evidence of bowel wall thickening, distention, or inflammatory changes. Vascular/Lymphatic: Aortic atherosclerosis. No enlarged abdominal or pelvic lymph nodes. Reproductive: Uterus and bilateral adnexa are unremarkable. Other: No abdominal wall hernia or abnormality. No abdominopelvic ascites. No pneumoperitoneum. Musculoskeletal: No acute or significant osseous findings. IMPRESSION: 1. No acute intra-abdominal process. Mostly fluid-filled colon, consistent with diarrheal illness. 2. Aortic Atherosclerosis (ICD10-I70.0). Electronically Signed   By: Obie Dredge M.D.   On: 03/30/2023 10:21         Scheduled Meds:  amLODipine  5 mg Oral Daily   aspirin EC  81 mg Oral Daily   atorvastatin  20 mg Oral q1800   enoxaparin (LOVENOX) injection  40 mg Subcutaneous Q24H   nebivolol  5 mg Oral Daily   psyllium  1 packet Oral Daily   Continuous Infusions:  sodium chloride 75 mL/hr at 03/31/23 0909     LOS: 0 days    Time spent: 57 minutes spent on chart review, discussion with nursing staff, consultants, updating family and interview/physical exam; more than 50% of that time was spent in counseling and/or coordination of care.    Alvira Philips [place], DO [facility] Available via Epic secure chat 7am-7pm After these hours, please refer to coverage provider listed on [email or web address] 03/31/2023, 11:59 AM

## 2023-03-31 NOTE — TOC Initial Note (Signed)
Transition of Care [facility]) - Initial/Assessment Note    Patient Details  Name: Connie Gilbert MRN: [id number] Date of Birth: 07-17-1946  Transition of Care [facility]) CM/SW Contact:    Durenda Guthrie, RN Phone Number: 03/31/2023, 10:38 AM  Clinical Narrative:                  77 yr old female, from home,AKI. Diarrhea and decreased urine output.    Transition of Care [facility]) Department has reviewed patient and no TOC needs have been identified at this time. We will continue to monitor patient advancement through Interdisciplinary progressions and if new patient needs arise, please place a consult.  Barriers to Discharge: Continued Medical Work up   Patient Goals and CMS Choice            Expected Discharge Plan and Services                                              Prior Living Arrangements/Services                       Activities of Daily Living Home Assistive Devices/Equipment: None ADL Screening (condition at time of admission) Patient's cognitive ability adequate to safely complete daily activities?: Yes Is the patient deaf or have difficulty hearing?: No Does the patient have difficulty seeing, even when wearing glasses/contacts?: No Does the patient have difficulty concentrating, remembering, or making decisions?: No Patient able to express need for assistance with ADLs?: Yes Does the patient have difficulty dressing or bathing?: No Independently performs ADLs?: Yes (appropriate for developmental age) Does the patient have difficulty walking or climbing stairs?: No Weakness of Legs: Both Weakness of Arms/Hands: Both  Permission Sought/Granted                  Emotional Assessment              Admission diagnosis:  Dehydration [E86.0] AKI (acute kidney injury) [N17.9] Patient Active Problem List   Diagnosis Date Noted   AKI (acute kidney injury) 03/30/2023   HTN (hypertension) 03/30/2023   HLD (hyperlipidemia) 03/30/2023    Antibiotic causing adverse effect 03/30/2023   Oliguria 03/30/2023   PCP:  Merri Brunette, MD Pharmacy:   [facility] [id number] - [address], [place] - [address] [place] [address] Phone: [phone] Fax: [phone]     Social Determinants of Health (SDOH) Social History: SDOH Screenings   Food Insecurity: No Food Insecurity (03/30/2023)  Housing: Low Risk  (03/30/2023)  Transportation Needs: No Transportation Needs (03/30/2023)  Utilities: Not At Risk (03/30/2023)  Tobacco Use: Medium Risk (03/30/2023)   SDOH Interventions:     Readmission Risk Interventions     No data to display

## 2023-04-01 DIAGNOSIS — I10 Essential (primary) hypertension: Secondary | ICD-10-CM | POA: Diagnosis not present

## 2023-04-01 DIAGNOSIS — E860 Dehydration: Secondary | ICD-10-CM

## 2023-04-01 DIAGNOSIS — R197 Diarrhea, unspecified: Secondary | ICD-10-CM | POA: Diagnosis not present

## 2023-04-01 DIAGNOSIS — N179 Acute kidney failure, unspecified: Secondary | ICD-10-CM | POA: Diagnosis not present

## 2023-04-01 DIAGNOSIS — L949 Localized connective tissue disorder, unspecified: Secondary | ICD-10-CM | POA: Diagnosis not present

## 2023-04-01 LAB — STOOL CULTURE REFLEX - RSASHR

## 2023-04-01 LAB — BASIC METABOLIC PANEL
Anion gap: 7 (ref 5–15)
BUN: 27 mg/dL — ABNORMAL HIGH (ref 8–23)
CO2: 16 mmol/L — ABNORMAL LOW (ref 22–32)
Calcium: 8 mg/dL — ABNORMAL LOW (ref 8.9–10.3)
Chloride: 115 mmol/L — ABNORMAL HIGH (ref 98–111)
Creatinine, Ser: 0.73 mg/dL (ref 0.44–1.00)
GFR, Estimated: >60 mL/min (ref >60)
Glucose, Bld: 87 mg/dL (ref 70–99)
Potassium: 3.6 mmol/L (ref 3.5–5.1)
Sodium: 138 mmol/L (ref 135–145)

## 2023-04-01 LAB — STOOL CULTURE

## 2023-04-01 LAB — MAGNESIUM: Magnesium: 1.5 mg/dL — ABNORMAL LOW (ref 1.7–2.4)

## 2023-04-01 MED ORDER — LOPERAMIDE HCL 2 MG PO CAPS
2.0000 mg | ORAL_CAPSULE | Freq: Three times a day (TID) | ORAL | Status: DC
  Administered 2023-04-01 – 2023-04-02 (×4): 2 mg via ORAL
  Filled 2023-04-01 (×4): qty 1

## 2023-04-01 MED ORDER — MAGNESIUM SULFATE 2 GM/50ML IV SOLN
2.0000 g | Freq: Once | INTRAVENOUS | Status: AC
  Administered 2023-04-01: 2 g via INTRAVENOUS
  Filled 2023-04-01: qty 50

## 2023-04-01 NOTE — Progress Notes (Addendum)
Patient ID: Connie Gilbert, female   DOB: 17-Nov-1946, 77 y.o.   MRN: [id number]    Progress Note   Subjective   Day # 2 CC; diarrhea x 3 weeks, acute kidney injury  C. difficile quick screen negative, GI path panel negative CRP 6/sed rate 30 Fecal elastase and fecal calprotectin pending  Potassium 3.6/BUN 27/creatinine 0.73/magnesium 1.5  Patient says she feels about the same no significant abdominal discomfort or cramping still does not have much appetite trying to force herself to eat, has a bad taste in her mouth.  She has had 2 diarrheal stools this morning and was up 3 times during the night with diarrhea    Objective   Vital signs in last 24 hours: Temp:  [97.9 F (36.6 C)-98 F (36.7 C)] 97.9 F (36.6 C) (04/17 0444) Pulse Rate:  [71-75] 71 (04/17 0444) Resp:  [16-19] 16 (04/17 0444) BP: (122-167)/(64-72) 167/64 (04/17 1007) SpO2:  [100 %] 100 % (04/17 0444) Last BM Date : 03/31/23 General:   Elderly white female in NAD Heart:  Regular rate and rhythm; no murmurs Lungs: Respirations even and unlabored, lungs CTA bilaterally Abdomen:  Soft, nontender , no focal tenderness and nondistended. Normal bowel sounds. Extremities:  Without edema. Neurologic:  Alert and oriented,  grossly normal neurologically. Psych:  Cooperative. Normal mood and affect.  Intake/Output from previous day: 04/16 0701 - 04/17 0700 In: 625 [I.V.:625] Out: -  Intake/Output this shift: No intake/output data recorded.  Lab Results: Recent Labs    03/30/23 0857 03/30/23 1311 03/31/23 0552  WBC 5.5 3.6* 3.1*  HGB 14.9 12.6 12.0  HCT 44.4 38.5 38.0  PLT 217 143* 121*   BMET Recent Labs    03/30/23 1300 03/30/23 1311 03/31/23 0552 04/01/23 0519  NA 135  --  137 138  K 5.0  --  3.9 3.6  CL 98  --  114* 115*  CO2 20*  --  13* 16*  GLUCOSE 103*  --  77 87  BUN 77*  --  45* 27*  CREATININE 2.68* 2.61* 1.20* 0.73  CALCIUM 9.2  --  7.7* 8.0*   LFT Recent Labs    03/31/23 0552   PROT 5.3*  ALBUMIN 2.4*  AST 17  ALT 28  ALKPHOS 51  BILITOT 0.9   PT/INR No results for input(s): "LABPROT", "INR" in the last 72 hours.       Assessment / Plan:     #78 77 year old white female with history of IBS, admitted currently with 3+ week history of persistent watery diarrhea, onset while she was taking a Z-Pak for sinusitis. Symptoms persisted with numerous watery bowel movements per day, poor appetite, nausea, progressive weakness and admitted on 03/30/2023 found to significant acute kidney injury  AKI was in setting of continued use of antihypertensives including olmesartan. Kidney injury has resolved  Etiology of diarrhea remains unclear, infectious workup is negative She had a colonoscopy last summer for a prolonged episode of diarrhea-this was a negative exam and random biopsies negative for microscopic colitis.  Recurrent diarrhea may be drug-induced i.e. antibiotic related but would hope that that would resolve soon She also started Plaquenil per her rheumatologist for a new diagnosis of undefined connective tissue disorder-Plaquenil is known to cause abdominal pain nausea diarrhea etc. She is also on a study drug-inclisiran-had initial injection towards the end of January.  This drug is also associated with diarrhea  Plan-diet as tolerated bland Continue Florastor twice daily x 1 month Continue Mycostatin swish  and spit 4 times daily x 2 weeks for possible mild oral candidiasis Await fecal elastase and fecal calprotectin  I discussed the need to try some sort of an antidiarrheal with the patient as this is going to hold up her discharge with numerous watery stools per day.  She had related previously that she felt uncomfortable in her abdomen on Imodium and felt better if she was able to have diarrhea, until the next episode. She had tried Questran in the past but did not like that.  Agreeable to try scheduled Imodium today will order 4 times daily before  meals and then at bedtime, she can refuse if she does not feel she needs it.  Discussed that if her current diarrheal episode is drug-induced secondary to either Plaquenil or the study drug inclisiran that symptoms may take weeks to resolve Recommend she stay off of Plaquenil until she sees her rheumatologist in follow-up in early May If diarrhea completely resolves off Plaquenil may be okay for her to continue the study drug, if diarrhea persisting then she should stop participation in the study.  Hopefully if improved symptomatically with Imodium she may be able to be discharged tomorrow with outpatient GI follow-up with Dr. Leone Gilbert    Principal Problem:   AKI (acute kidney injury) Active Problems:   HTN (hypertension)   HLD (hyperlipidemia)   Antibiotic causing adverse effect   Oliguria   Acute renal failure (ARF)     LOS: 1 day   Connie Esterwood  PA-C 04/01/2023, 10:44 AM  GI ATTENDING  Interval history data reviewed.  Agree with interval progress note as outlined above.  Agree with trying running Imodium.  Diagnosis of significant persistent diarrhea with dehydration unclear.  Conceivable she could have small bowel olmesartan enteropathy.  This versus drug related versus other.  If no improvement, would consider upper endoscopy with duodenal biopsies and flexible sigmoidoscopy with repeat colonic biopsies (rule out the development of microscopic colitis).  Will continue to follow.  Connie Gilbert. Connie Gilbert., M.D. [facility] Division of Gastroenterology

## 2023-04-01 NOTE — Progress Notes (Signed)
Mobility Specialist - Progress Note   04/01/23 1217  Mobility  Activity Ambulated with assistance in hallway  Level of Assistance Independent after set-up  Assistive Device None  Distance Ambulated (ft) 500 ft  Activity Response Tolerated well  Mobility Referral Yes  $Mobility charge 1 Mobility   Pt received in bed and agreed to mobility, assisted with lines but pt IND w/ mobility, had no issues throughout session, pt returned to bed with all needs met.   Marilynne Halsted Mobility Specialist

## 2023-04-01 NOTE — Progress Notes (Addendum)
PROGRESS NOTE  Connie Gilbert  ZOX:[id number] DOB: September 09, 1946 DOA: 03/30/2023 PCP: Merri Brunette, MD   Brief Narrative: Patient is a 77 year old female with history of hypertension, hyperlipidemia, IBS presented here with complaint of nausea, diarrhea for 3 weeks, lower abdominal discomfort.  On presentation she was hemodynamically stable.  Lab work showed creatinine of 3.1, baseline creatinine normal.  CT abdomen/pelvis did not show acute intra-abdominal process, fluid-filled colon consistent with diarrheal illness.  Nephrology, GI consulted.  Assessment & Plan:  Principal Problem:   AKI (acute kidney injury) Active Problems:   HTN (hypertension)   HLD (hyperlipidemia)   Antibiotic causing adverse effect   Oliguria   Acute renal failure (ARF)   AKI: Presented with diarrhea.  AKI is most likely secondary to volume depletion.  Currently kidney function is stable.  Creatinine normal.  CT abdomen/pelvis showed normal appearance of kidneys without lesion or hydronephrosis.  Home ARB/hydrochlorothiazide on hold.  Nephrology signed off  Diarrhea/history of IBS: Follows with gastroenterology Dr. Leone Payor.  Underwent extensive workup on May 2023 with pancreatic elastase, calprotectin, lactoferrin, GI PCR, C. difficile, ova, parasite which were all negative.  Colonoscopy showed mild nonspecific inflammation with no diagnostic features of inflammatory bowel disease, microscopic colitis.  GI following.  Ordered fecal calprotectin, elastase: Looks like not collected yet.  Gi pathogen panel, C. difficile negative. Started on [employer]. Also started on Mycostatin oral suspension for mild oral candidiasis.  Recommended bland diet. GI recommended to hold Plaquenil as an outpatient. Diarrhea remains the same, had multiple loose stools last night and on ethis morning  Hypertension: Currently blood pressure stable.  Takes ARB, hydrochlorothiazide, nebivolol at home.  ARB/HCTZ on hold.  Started on amlodipine 5  mg daily.  Hyperlipidemia: Continue Lipitor  Hypomagnesemia: Supplemented with magnesium  History of connective tissue disease: Follows with rheumatology.  ANA positive on 6/23.  On hydroxychloroquine.  ESR, CRP elevated.  Follows with  Dr. Deanne Coffer        DVT prophylaxis:enoxaparin (LOVENOX) injection 40 mg Start: 03/31/23 1800 SCDs Start: 03/30/23 1259     Code Status: Full Code  Family Communication: None at bedside  Patient status:Inpatient  Patient is from :Home  Anticipated discharge to:1-2 days  Estimated DC date:1-2 days   Consultants: GI  Procedures:None  Antimicrobials:  Anti-infectives (From admission, onward)    None       Subjective: Patient seen and examined at bedside today.  Hemodynamically stable.  Comfortable.  She had multiple loose watery bowel movements last night and 1 this morning.  Denies abdomen pain, nausea or vomiting  Objective: Vitals:   03/31/23 0554 03/31/23 1344 03/31/23 1928 04/01/23 0444  BP: 121/72 122/72 136/67 134/66  Pulse: 78 73 75 71  Resp: Temp: 98.2 F (36.8 C)  98 F (36.7 C) 97.9 F (36.6 C)  TempSrc:   Oral Oral  SpO2: 98% 100% 100% 100%  Weight:      Height:        Intake/Output Summary (Last 24 hours) at 04/01/2023 0926 Last data filed at 03/31/2023 1729 Gross per 24 hour  Intake 625 ml  Output --  Net 625 ml   Filed Weights   03/30/23 2102  Weight: 57.2 kg    Examination:  General exam: Overall comfortable, not in distress HEENT: PERRL Respiratory system:  no wheezes or crackles  Cardiovascular system: S1 & S2 heard, RRR.  Gastrointestinal system: Abdomen is nondistended, soft and nontender. Central nervous system: Alert and oriented Extremities: No edema,  no clubbing ,no cyanosis Skin: No rashes, no ulcers,no icterus     Data Reviewed: I have personally reviewed following labs and imaging studies  CBC: Recent Labs  Lab 03/30/23 0857 03/30/23 1311 03/31/23 0552  WBC 5.5  3.6* 3.1*  NEUTROABS 2.9  --   --   HGB 14.9 12.6 12.0  HCT 44.4 38.5 38.0  MCV 93.3 95.1 98.7  PLT 217 143* 121*   Basic Metabolic Panel: Recent Labs  Lab 03/30/23 0857 03/30/23 1300 03/30/23 1311 03/31/23 0552 04/01/23 0519  NA 131* 135  --  137 138  K 3.9 5.0  --  3.9 3.6  CL 97* 98  --  114* 115*  CO2 18* 20*  --  13* 16*  GLUCOSE 108* 103*  --  77 87  BUN 76* 77*  --  45* 27*  CREATININE 3.13* 2.68* 2.61* 1.20* 0.73  CALCIUM 10.0 9.2  --  7.7* 8.0*  MG  --   --   --   --  1.5*     Recent Results (from the past 240 hour(s))  Gastrointestinal Panel by PCR , Stool     Status: None   Collection Time: 03/30/23 11:33 AM   Specimen: Stool  Result Value Ref Range Status   Campylobacter species NOT DETECTED NOT DETECTED Final   Plesimonas shigelloides NOT DETECTED NOT DETECTED Final   Salmonella species NOT DETECTED NOT DETECTED Final   Yersinia enterocolitica NOT DETECTED NOT DETECTED Final   Vibrio species NOT DETECTED NOT DETECTED Final   Vibrio cholerae NOT DETECTED NOT DETECTED Final   Enteroaggregative E coli (EAEC) NOT DETECTED NOT DETECTED Final   Enteropathogenic E coli (EPEC) NOT DETECTED NOT DETECTED Final   Enterotoxigenic E coli (ETEC) NOT DETECTED NOT DETECTED Final   Shiga like toxin producing E coli (STEC) NOT DETECTED NOT DETECTED Final   Shigella/Enteroinvasive E coli (EIEC) NOT DETECTED NOT DETECTED Final   Cryptosporidium NOT DETECTED NOT DETECTED Final   Cyclospora cayetanensis NOT DETECTED NOT DETECTED Final   Entamoeba histolytica NOT DETECTED NOT DETECTED Final   Giardia lamblia NOT DETECTED NOT DETECTED Final   Adenovirus F40/41 NOT DETECTED NOT DETECTED Final   Astrovirus NOT DETECTED NOT DETECTED Final   Norovirus GI/GII NOT DETECTED NOT DETECTED Final   Rotavirus A NOT DETECTED NOT DETECTED Final   Sapovirus (I, II, IV, and V) NOT DETECTED NOT DETECTED Final    Comment: Performed at [facility], [address] [place],  [place] [address]  C Difficile Quick Screen w PCR reflex     Status: None   Collection Time: 03/30/23  3:36 PM   Specimen: STOOL  Result Value Ref Range Status   C Diff antigen NEGATIVE NEGATIVE Final   C Diff toxin NEGATIVE NEGATIVE Final   C Diff interpretation No C. difficile detected.  Final    Comment: Performed at [facility], [address] [place], [place] [address]     Radiology Studies: CT ABDOMEN PELVIS WO CONTRAST  Result Date: 03/30/2023 CLINICAL DATA:  Lower abdominal pain with nausea and diarrhea for the past 3 weeks. EXAM: CT ABDOMEN AND PELVIS WITHOUT CONTRAST TECHNIQUE: Multidetector CT imaging of the abdomen and pelvis was performed following the standard protocol without IV contrast. RADIATION DOSE REDUCTION: This exam was performed according to the departmental dose-optimization program which includes automated exposure control, adjustment of the mA and/or kV according to patient size and/or use of iterative reconstruction technique. COMPARISON:  Right upper quadrant ultrasound  dated June 11, 2022. FINDINGS: Lower chest: No acute abnormality. Hepatobiliary: No focal liver abnormality is seen. Status post cholecystectomy. Mild dilatation of the common bile duct measuring 11-12 mm, likely due to reservoir effect. No intrahepatic biliary dilatation. Pancreas: Unremarkable. No pancreatic ductal dilatation or surrounding inflammatory changes. Spleen: Normal in size without focal abnormality. Adrenals/Urinary Tract: Adrenal glands are unremarkable. Kidneys are normal, without renal calculi, focal lesion, or hydronephrosis. Bladder is unremarkable. Stomach/Bowel: Stomach is within normal limits. History of prior appendectomy. Mostly fluid-filled colon. No evidence of bowel wall thickening, distention, or inflammatory changes. Vascular/Lymphatic: Aortic atherosclerosis. No enlarged abdominal or pelvic lymph nodes. Reproductive: Uterus and bilateral adnexa are unremarkable.  Other: No abdominal wall hernia or abnormality. No abdominopelvic ascites. No pneumoperitoneum. Musculoskeletal: No acute or significant osseous findings. IMPRESSION: 1. No acute intra-abdominal process. Mostly fluid-filled colon, consistent with diarrheal illness. 2. Aortic Atherosclerosis (ICD10-I70.0). Electronically Signed   By: Obie Dredge M.D.   On: 03/30/2023 10:21    Scheduled Meds:  amLODipine  5 mg Oral Daily   aspirin EC  81 mg Oral Daily   atorvastatin  20 mg Oral q1800   enoxaparin (LOVENOX) injection  40 mg Subcutaneous Q24H   nebivolol  5 mg Oral Daily   nystatin  5 mL Oral QID   psyllium  1 packet Oral Daily   saccharomyces boulardii  250 mg Oral BID   Continuous Infusions:  sodium chloride 75 mL/hr at 03/31/23 1729   magnesium sulfate bolus IVPB       LOS: 1 day   Burnadette Pop, MD [facility] P4/17/2024, 9:26 AM

## 2023-04-01 NOTE — Plan of Care (Signed)
  Problem: Education: Goal: Knowledge of General Education information will improve Description: Including pain rating scale, medication(s)/side effects and non-pharmacologic comfort measures Outcome: Progressing   Problem: Activity: Goal: Risk for activity intolerance will decrease Outcome: Adequate for Discharge   

## 2023-04-02 DIAGNOSIS — N179 Acute kidney failure, unspecified: Secondary | ICD-10-CM | POA: Diagnosis not present

## 2023-04-02 DIAGNOSIS — L949 Localized connective tissue disorder, unspecified: Secondary | ICD-10-CM | POA: Diagnosis not present

## 2023-04-02 DIAGNOSIS — I10 Essential (primary) hypertension: Secondary | ICD-10-CM | POA: Diagnosis not present

## 2023-04-02 DIAGNOSIS — R197 Diarrhea, unspecified: Secondary | ICD-10-CM | POA: Diagnosis not present

## 2023-04-02 DIAGNOSIS — D696 Thrombocytopenia, unspecified: Secondary | ICD-10-CM

## 2023-04-02 LAB — CBC
HCT: 36.5 % (ref 36.0–46.0)
Hemoglobin: 12 g/dL (ref 12.0–15.0)
MCH: 31.3 pg (ref 26.0–34.0)
MCHC: 32.9 g/dL (ref 30.0–36.0)
MCV: 95.3 fL (ref 80.0–100.0)
Platelets: 126 10*3/uL — ABNORMAL LOW (ref 150–400)
RBC: 3.83 MIL/uL — ABNORMAL LOW (ref 3.87–5.11)
RDW: 14.4 % (ref 11.5–15.5)
WBC: 4.1 10*3/uL (ref 4.0–10.5)
nRBC: 0 % (ref 0.0–0.2)

## 2023-04-02 LAB — BASIC METABOLIC PANEL
Anion gap: 8 (ref 5–15)
BUN: 16 mg/dL (ref 8–23)
CO2: 14 mmol/L — ABNORMAL LOW (ref 22–32)
Calcium: 7.8 mg/dL — ABNORMAL LOW (ref 8.9–10.3)
Chloride: 115 mmol/L — ABNORMAL HIGH (ref 98–111)
Creatinine, Ser: 0.65 mg/dL (ref 0.44–1.00)
GFR, Estimated: >60 mL/min (ref >60)
Glucose, Bld: 88 mg/dL (ref 70–99)
Potassium: 3.3 mmol/L — ABNORMAL LOW (ref 3.5–5.1)
Sodium: 137 mmol/L (ref 135–145)

## 2023-04-02 LAB — MAGNESIUM: Magnesium: 1.7 mg/dL (ref 1.7–2.4)

## 2023-04-02 MED ORDER — LOPERAMIDE HCL 2 MG PO CAPS: 2.0000 mg | ORAL_CAPSULE | Freq: Three times a day (TID) | ORAL | 0 refills | Status: DC | PRN

## 2023-04-02 MED ORDER — POTASSIUM CHLORIDE CRYS ER 20 MEQ PO TBCR
40.0000 meq | EXTENDED_RELEASE_TABLET | Freq: Once | ORAL | Status: AC
  Administered 2023-04-02: 40 meq via ORAL
  Filled 2023-04-02: qty 2

## 2023-04-02 MED ORDER — SODIUM BICARBONATE 650 MG PO TABS: 650.0000 mg | ORAL_TABLET | Freq: Three times a day (TID) | ORAL | 0 refills | Status: AC

## 2023-04-02 MED ORDER — SODIUM BICARBONATE 650 MG PO TABS
650.0000 mg | ORAL_TABLET | Freq: Three times a day (TID) | ORAL | Status: DC
  Administered 2023-04-02: 650 mg via ORAL
  Filled 2023-04-02: qty 1

## 2023-04-02 MED ORDER — AMLODIPINE BESYLATE 10 MG PO TABS: 10.0000 mg | ORAL_TABLET | Freq: Every day | ORAL | 0 refills | Status: DC

## 2023-04-02 MED ORDER — SACCHAROMYCES BOULARDII 250 MG PO CAPS: 250.0000 mg | ORAL_CAPSULE | Freq: Two times a day (BID) | ORAL | 0 refills | Status: DC

## 2023-04-02 MED ORDER — POTASSIUM CHLORIDE CRYS ER 20 MEQ PO TBCR: 40.0000 meq | EXTENDED_RELEASE_TABLET | Freq: Every day | ORAL | 0 refills | Status: DC

## 2023-04-02 NOTE — Progress Notes (Addendum)
Progress Note  Primary GI: Dr. Leone Payor   Subjective  Chief Complaint: Diarrhea 3 to 4 weeks with AKI  No family was present at the time of my evaluation. Patient received 2 doses of Imodium yesterday, 1 bowel movement prior to bed 1 last night and 1 this morning, continues to be watery but less volume. Denies any associated nausea, vomiting, fevers, chills, abdominal pain.  No hematochezia.  Has had decreased appetite but has had this entire time with 12 pound weight loss.    Objective   Vital signs in last 24 hours: Temp:  [97.3 F (36.3 C)-97.5 F (36.4 C)] 97.5 F (36.4 C) (04/18 0419) Pulse Rate:  [72-74] 72 (04/18 0419) Resp:  [16-18] 16 (04/18 0419) BP: (155-167)/(64-73) 157/73 (04/18 0419) SpO2:  [98 %-100 %] 98 % (04/18 0419) Last BM Date : 04/01/23 Last BM recorded by nurses in past 5 days No data recorded  General:   female in no acute distress  Heart:  Regular rate and rhythm; no murmurs Pulm: Clear anteriorly; no wheezing Abdomen:  Soft, Non-distended AB, Active bowel sounds. No tenderness . Without guarding and Without rebound, No organomegaly appreciated. Extremities:  without  edema. Neurologic:  Alert and  oriented x4;  No focal deficits.  Psych:  Cooperative. Normal mood and affect.  Intake/Output from previous day: No intake/output data recorded. Intake/Output this shift: No intake/output data recorded.  Studies/Results: No results found.  Lab Results: Recent Labs    03/30/23 1311 03/31/23 0552 04/02/23 0557  WBC 3.6* 3.1* 4.1  HGB 12.6 12.0 12.0  HCT 38.5 38.0 36.5  PLT 143* 121* 126*   BMET Recent Labs    03/31/23 0552 04/01/23 0519 04/02/23 0557  NA 137 138 137  K 3.9 3.6 3.3*  CL 114* 115* 115*  CO2 13* 16* 14*  GLUCOSE 77 87 88  BUN 45* 27* 16  CREATININE 1.20* 0.73 0.65  CALCIUM 7.7* 8.0* 7.8*   LFT Recent Labs    03/31/23 0552  PROT 5.3*  ALBUMIN 2.4*  AST 17  ALT 28  ALKPHOS 51  BILITOT 0.9   PT/INR No  results for input(s): "LABPROT", "INR" in the last 72 hours.   Scheduled Meds:  amLODipine  5 mg Oral Daily   aspirin EC  81 mg Oral Daily   atorvastatin  20 mg Oral q1800   enoxaparin (LOVENOX) injection  40 mg Subcutaneous Q24H   loperamide  2 mg Oral TID AC & HS   nebivolol  5 mg Oral Daily   nystatin  5 mL Oral QID   psyllium  1 packet Oral Daily   saccharomyces boulardii  250 mg Oral BID   Continuous Infusions:    Patient profile:   77 year old female with 4-week history of persistent diarrhea 5-7 bowel movements a day associated nausea and poor appetite sporadic episodes of vomiting at home, associated AKI.  Onset after azithromycin.   Impression/Plan:   Diarrhea for weeks with poor appetite, 12 pound weight loss, nausea and AKI 2023 colonoscopy for diarrhea unremarkable, random biopsies negative for IBD or microscopic colitis Negative C. difficile, GI pathogen Suspected medications contributing: olmesartan , Plaquenil since January and Inclisiran study which have been discontinued -Sed rate 30, CRP 6, no associated anemia. -Recent normal colonoscopy for diarrhea 1 year ago, with associated nausea, vomiting, weight loss and diarrhea suspicious for olmesartan enteropathy versus other medications.  -Patient feels that the loose stools are decreasing and feeling better, interested in potentially going home today,  patient symptomatically doing better.  -Pending fecal calprotectin and pancreatic elastase -Continue supportive care, diet as tolerated -Continue Imodium as needed -Continue Florastor twice daily for 1 month -Continue to stay off suspicious medications with follow-up with rheumatologist -Can plan for outpatient follow-up with our office and potential outpatient endoscopic evaluation if symptoms continue.  AKI in setting of diarrhea BUN 16 Cr 0.65  Potassium 3.3  Magnesium 1.7  Replace potassium, improving  History of connective tissue disease Started on  Plaquenil in January  Thrombocytopenia Monitor  Principal Problem:   AKI (acute kidney injury) Active Problems:   HTN (hypertension)   HLD (hyperlipidemia)   Antibiotic causing adverse effect   Oliguria   Acute renal failure (ARF)   Diarrhea   Dehydration    LOS: 2 days   Doree Albee  04/02/2023, 8:12 AM  GI ATTENDING  Interval history data reviewed.  Agree with interval progress note as outlined above.  Discussed with GI PA-C.  The patient was discharged prior to my seeing her this afternoon.  However, she was doing better.  On Imodium.  Has close follow-up with Dr. Leone Payor or his advanced practitioner.  Wilhemina Bonito. Eda Keys., M.D. [facility] Division of Gastroenterology

## 2023-04-02 NOTE — Progress Notes (Signed)
Mobility Specialist - Progress Note   04/02/23 1034  Mobility  Activity Ambulated with assistance in hallway  Level of Assistance Standby assist, set-up cues, supervision of patient - no hands on  Assistive Device None  Distance Ambulated (ft) 500 ft  Activity Response Tolerated well  Mobility Referral Yes  $Mobility charge 1 Mobility   Pt received in bed and agreed to mobility, had no issues throughout session. Pt returned to chair with all needs met.   Marilynne Halsted Mobility Specialist

## 2023-04-02 NOTE — Progress Notes (Signed)
Initial Nutrition Assessment  DOCUMENTATION CODES:   Non-severe (moderate) malnutrition in context of acute illness/injury  INTERVENTION:   -Provided "Diarrhea Nutrition Therapy" handout to patient  -Recommended patient take daily chewable multivitamin given nutrient losses from consistent diarrhea  NUTRITION DIAGNOSIS:   Moderate Malnutrition related to acute illness, diarrhea as evidenced by mild fat depletion, mild muscle depletion.  GOAL:   Patient will meet greater than or equal to 90% of their needs  MONITOR:   PO intake, Supplement acceptance, Labs, Weight trends, I & O's  REASON FOR ASSESSMENT:   Malnutrition Screening Tool    ASSESSMENT:   76 year old female with history of hypertension, hyperlipidemia, IBS presented here with complaint of nausea, diarrhea for 3 weeks, lower abdominal discomfort.  Patient in room, sitting in chair. Reports she continues to have diarrhea, has started imodium. States she was told it could take a while for her BMs to improve. Pt reports she hasn't been eating as well during this time. Has had diarrhea for weeks. Pt takes daily Metamucil capsules daily. Was not open to other fiber supplements at time of visit. Provided "Diarrhea" handout and answered pt's diet related questions. Pt states she has already been trying to eat easy to digest foods and was considering gluten free and FODMAP diets.  Pt reports having off tastes as well which deters her from eating. May indicate nutritional deficiency from diarrhea.  Per MD note, pt discharging today.  Per patient, pt has lost ~12 lbs since symptom onset. UBW ~124-125 lbs. Current weight recorded: 126 lbs. Likely copied over from weight on 09/02/22.   Medications: Imodium, KLOR-CON, Florastor  Labs reviewed: Low K   NUTRITION - FOCUSED PHYSICAL EXAM:  Flowsheet Row Most Recent Value  Orbital Region Moderate depletion  Upper Arm Region Mild depletion  Thoracic and Lumbar Region Mild  depletion  Buccal Region Mild depletion  Temple Region Mild depletion  Clavicle Bone Region Mild depletion  Clavicle and Acromion Bone Region Mild depletion  Scapular Bone Region Mild depletion  Dorsal Hand Mild depletion  Patellar Region Unable to assess  Anterior Thigh Region Unable to assess  Posterior Calf Region Unable to assess  Edema (RD Assessment) None  Hair Reviewed  Eyes Reviewed  Mouth Reviewed  [dry, taste changes]  Skin Reviewed  [dry]  Nails Reviewed       Diet Order:   Diet Order             Diet - low sodium heart healthy           Diet regular Room service appropriate? Yes; Fluid consistency: Thin  Diet effective now                   EDUCATION NEEDS:   Education needs have been addressed  Skin:  Skin Assessment: Reviewed RN Assessment  Last BM:  4/17  Height:   Ht Readings from Last 1 Encounters:  03/30/23  (1.549 m)    Weight:   Wt Readings from Last 1 Encounters:  03/30/23 57.2 kg    BMI:  Body mass index is 23.83 kg/m.  Estimated Nutritional Needs:   Kcal:  1600-1800  Protein:  65-75g  Fluid:  1.8L/day  Tilda Franco, MS, RD, LDN Inpatient Clinical Dietitian Contact information available via Amion

## 2023-04-02 NOTE — Discharge Summary (Signed)
Physician Discharge Summary  ALIVIANA BURDELL WUJ:[id number] DOB: 1946-04-02 DOA: 03/30/2023  PCP: Merri Brunette, MD  Admit date: 03/30/2023 Discharge date: 04/02/2023  Admitted From: Home Disposition:  Home  Discharge Condition:Stable CODE STATUS:FULL Diet recommendation: Heart Healthy   Brief/Interim Summary: Patient is a 77 year old female with history of hypertension, hyperlipidemia, IBS presented here with complaint of nausea, diarrhea for 3 weeks, lower abdominal discomfort.  On presentation she was hemodynamically stable.  Lab work showed creatinine of 3.1, baseline creatinine normal.  CT abdomen/pelvis did not show acute intra-abdominal process, fluid-filled colon consistent with diarrheal illness.  Nephrology, GI consulted.  Kidney function improved with IV fluid.  Her abdomen remains soft and nondistended.  She denies any abdominal pain.  She still has some diarrhea, we anticipate that it will improve with Imodium.  GI cleared her for discharge and will follow-up as an outpatient  Following problems were addressed during the hospitalization:  AKI: Presented with diarrhea.  AKI is most likely secondary to volume depletion.  Currently kidney function is stable.  Creatinine normal.  CT abdomen/pelvis showed normal appearance of kidneys without lesion or hydronephrosis.  Nephrology signed off   Diarrhea/history of IBS: Follows with gastroenterology Dr. Leone Payor.  Underwent extensive workup on May 2023 with pancreatic elastase, calprotectin, lactoferrin, GI PCR, C. difficile, ova, parasite which were all negative.  Colonoscopy showed mild nonspecific inflammation with no diagnostic features of inflammatory bowel disease, microscopic colitis.  GI  were following. Pending fecal calprotectin, elastase. Gi pathogen panel, C. difficile negative. Started on [employer]. Also given Mycostatin oral suspension for mild oral candidiasis.   There is also suspicion that Benicar might be contributing to  diarrhea so it has been stopped. Patient is still having intermittent diarrhea.  GI recommended to follow-up as an outpatient.    Hypertension:   Takes benicar, hydrochlorothiazide, nebivolol at home.  Benicar will be stopped.  Started on amlodipine 10 mg daily.   Hyperlipidemia: Continue Lipitor   Hypokalemia: Continue supplementation.  Check BMP test in a week  Non-anion gap metabolic acidosis: Most likely secondary to diarrhea.  Started on sodium bicarb.  Check BMP in a week  History of connective tissue disease: Follows with rheumatology.  ANA positive on 6/23.  On hydroxychloroquine.  ESR, CRP elevated.  Follows with  Dr. Deanne Coffer.  Denies any joint pain at the moment.   Discharge Diagnoses:  Principal Problem:   AKI (acute kidney injury) Active Problems:   HTN (hypertension)   HLD (hyperlipidemia)   Antibiotic causing adverse effect   Oliguria   Acute renal failure (ARF)   Diarrhea   Dehydration    Discharge Instructions  Discharge Instructions     Diet - low sodium heart healthy   Complete by: As directed    Discharge instructions   Complete by: As directed    1)Please take prescribed medications as instructed 2)Monitor your blood pressure at home 3)Follow up with your PCP in a week,  do a BMP test to check your kidney function and potassium 4)You will be called by gastroenterology for follow-up appointment   Increase activity slowly   Complete by: As directed       Allergies as of 04/02/2023       Reactions   Alendronate Sodium Other (See Comments)   Iodinated Contrast Media Hives, Other (See Comments)   Uncoded Allergy. Allergen: ivp dye   Nitrofurantoin Other (See Comments)        Medication List     STOP taking these medications  olmesartan 40 MG tablet Commonly known as: BENICAR       TAKE these medications    amLODipine 10 MG tablet Commonly known as: NORVASC Take 1 tablet (10 mg total) by mouth daily. Start taking on: April 03, 2023 What changed:  medication strength how much to take   atorvastatin 20 MG tablet Commonly known as: LIPITOR Take 20 mg by mouth daily at 6 PM.   Calcium + D3 600-200 MG-UNIT Tabs Take 600 mg by mouth daily.   D 1000 25 MCG (1000 UT) capsule Generic drug: Cholecalciferol Take 1,000 Units by mouth daily.   hydrochlorothiazide 25 MG tablet Commonly known as: HYDRODIURIL Take 25 mg by mouth daily.   hydroxychloroquine 200 MG tablet Commonly known as: PLAQUENIL Take 200 mg by mouth daily.   loperamide 2 MG capsule Commonly known as: IMODIUM Take 1 capsule (2 mg total) by mouth every 8 (eight) hours as needed for diarrhea or loose stools.   nebivolol 5 MG tablet Commonly known as: BYSTOLIC Take 5 mg by mouth daily.   ondansetron 4 MG disintegrating tablet Commonly known as: ZOFRAN-ODT Take 4 mg by mouth 3 (three) times daily as needed for nausea or vomiting.   potassium chloride SA 20 MEQ tablet Commonly known as: KLOR-CON M Take 2 tablets (40 mEq total) by mouth daily for 3 days. Start taking on: April 03, 2023   psyllium 58.6 % packet Commonly known as: METAMUCIL Take 1 packet by mouth daily.   QC TUMERIC COMPLEX PO Take 400 mg by mouth daily.   saccharomyces boulardii 250 MG capsule Commonly known as: FLORASTOR Take 1 capsule (250 mg total) by mouth 2 (two) times daily.   sodium bicarbonate 650 MG tablet Take 1 tablet (650 mg total) by mouth 3 (three) times daily for 7 days.        Follow-up Information     Merri Brunette, MD. Schedule an appointment as soon as possible for a visit in 1 week(s).   Specialty: Internal Medicine Contact information: [address] [place] [address] [phone]                Allergies  Allergen Reactions   Alendronate Sodium Other (See Comments)   Iodinated Contrast Media Hives and Other (See Comments)    Uncoded Allergy. Allergen: ivp dye   Nitrofurantoin Other (See Comments)     Consultations: GI   Procedures/Studies: CT ABDOMEN PELVIS WO CONTRAST  Result Date: 03/30/2023 CLINICAL DATA:  Lower abdominal pain with nausea and diarrhea for the past 3 weeks. EXAM: CT ABDOMEN AND PELVIS WITHOUT CONTRAST TECHNIQUE: Multidetector CT imaging of the abdomen and pelvis was performed following the standard protocol without IV contrast. RADIATION DOSE REDUCTION: This exam was performed according to the departmental dose-optimization program which includes automated exposure control, adjustment of the mA and/or kV according to patient size and/or use of iterative reconstruction technique. COMPARISON:  Right upper quadrant ultrasound dated June 11, 2022. FINDINGS: Lower chest: No acute abnormality. Hepatobiliary: No focal liver abnormality is seen. Status post cholecystectomy. Mild dilatation of the common bile duct measuring 11-12 mm, likely due to reservoir effect. No intrahepatic biliary dilatation. Pancreas: Unremarkable. No pancreatic ductal dilatation or surrounding inflammatory changes. Spleen: Normal in size without focal abnormality. Adrenals/Urinary Tract: Adrenal glands are unremarkable. Kidneys are normal, without renal calculi, focal lesion, or hydronephrosis. Bladder is unremarkable. Stomach/Bowel: Stomach is within normal limits. History of prior appendectomy. Mostly fluid-filled colon. No evidence of bowel wall thickening, distention, or  inflammatory changes. Vascular/Lymphatic: Aortic atherosclerosis. No enlarged abdominal or pelvic lymph nodes. Reproductive: Uterus and bilateral adnexa are unremarkable. Other: No abdominal wall hernia or abnormality. No abdominopelvic ascites. No pneumoperitoneum. Musculoskeletal: No acute or significant osseous findings. IMPRESSION: 1. No acute intra-abdominal process. Mostly fluid-filled colon, consistent with diarrheal illness. 2. Aortic Atherosclerosis (ICD10-I70.0). Electronically Signed   By: Obie Dredge M.D.   On: 03/30/2023  10:21      Subjective: Patient seen and examined the bedside today.  Hemodynamically stable.  Any abdomen pain, nausea or vomiting.  She still has loose stools.  She is eager to go home today  Discharge Exam: Vitals:   04/02/23 0419 04/02/23 0835  BP: (!) 157/73 (!) 170/76  Pulse: 72 65  Resp: 16   Temp: (!) 97.5 F (36.4 C)   SpO2: 98%    Vitals:   04/01/23 1251 04/01/23 1915 04/02/23 0419 04/02/23 0835  BP: (!) 155/69 (!) 166/72 (!) 157/73 (!) 170/76  Pulse: 74 72 72 65  Resp: 18 18 16    Temp:  (!) 97.3 F (36.3 C) (!) 97.5 F (36.4 C)   TempSrc:  Oral Oral   SpO2: 100% 100% 98%   Weight:      Height:        General: Pt is alert, awake, not in acute distress Cardiovascular: RRR, S1/S2 +, no rubs, no gallops Respiratory: CTA bilaterally, no wheezing, no rhonchi Abdominal: Soft, NT, ND, bowel sounds + Extremities: no edema, no cyanosis    The results of significant diagnostics from this hospitalization (including imaging, microbiology, ancillary and laboratory) are listed below for reference.     Microbiology: Recent Results (from the past 240 hour(s))  Gastrointestinal Panel by PCR , Stool     Status: None   Collection Time: 03/30/23 11:33 AM   Specimen: Stool  Result Value Ref Range Status   Campylobacter species NOT DETECTED NOT DETECTED Final   Plesimonas shigelloides NOT DETECTED NOT DETECTED Final   Salmonella species NOT DETECTED NOT DETECTED Final   Yersinia enterocolitica NOT DETECTED NOT DETECTED Final   Vibrio species NOT DETECTED NOT DETECTED Final   Vibrio cholerae NOT DETECTED NOT DETECTED Final   Enteroaggregative E coli (EAEC) NOT DETECTED NOT DETECTED Final   Enteropathogenic E coli (EPEC) NOT DETECTED NOT DETECTED Final   Enterotoxigenic E coli (ETEC) NOT DETECTED NOT DETECTED Final   Shiga like toxin producing E coli (STEC) NOT DETECTED NOT DETECTED Final   Shigella/Enteroinvasive E coli (EIEC) NOT DETECTED NOT DETECTED Final    Cryptosporidium NOT DETECTED NOT DETECTED Final   Cyclospora cayetanensis NOT DETECTED NOT DETECTED Final   Entamoeba histolytica NOT DETECTED NOT DETECTED Final   Giardia lamblia NOT DETECTED NOT DETECTED Final   Adenovirus F40/41 NOT DETECTED NOT DETECTED Final   Astrovirus NOT DETECTED NOT DETECTED Final   Norovirus GI/GII NOT DETECTED NOT DETECTED Final   Rotavirus A NOT DETECTED NOT DETECTED Final   Sapovirus (I, II, IV, and V) NOT DETECTED NOT DETECTED Final    Comment: Performed at [facility], [address] [place], [place] [address]  C Difficile Quick Screen w PCR reflex     Status: None   Collection Time: 03/30/23  3:36 PM   Specimen: STOOL  Result Value Ref Range Status   C Diff antigen NEGATIVE NEGATIVE Final   C Diff toxin NEGATIVE NEGATIVE Final   C Diff interpretation No C. difficile detected.  Final    Comment: Performed at [facility],  [address] [place], [place] [address]  Stool culture     Status: None (Preliminary result)   Collection Time: 03/30/23  3:36 PM   Specimen: STOOL  Result Value Ref Range Status   Salmonella/Shigella Screen Final report  Final    Comment: (NOTE) Performed At: [facility] [address] [place], [place] [id number] Jolene Schimke MD XB:[id number]    Campylobacter Culture PENDING  Incomplete   E coli, Shiga toxin Assay PENDING  Incomplete  STOOL CULTURE REFLEX - RSASHR     Status: None   Collection Time: 03/30/23  3:36 PM  Result Value Ref Range Status   Stool Culture result 1 (RSASHR) Comment  Final    Comment: (NOTE) No Salmonella or Shigella recovered. Performed At: [facility] [address] [place], [place] [id number] Jolene Schimke MD [id number]      Labs: BNP (last 3 results) No results for input(s): "BNP" in the last 8760 hours. Basic Metabolic Panel: Recent Labs  Lab 03/30/23 0857 03/30/23 1300 03/30/23 1311 03/31/23 0552 04/01/23 0519 04/02/23 0557   NA 131* 135  --  137 138 137  K 3.9 5.0  --  3.9 3.6 3.3*  CL 97* 98  --  114* 115* 115*  CO2 18* 20*  --  13* 16* 14*  GLUCOSE 108* 103*  --  77 87 88  BUN 76* 77*  --  45* 27* 16  CREATININE 3.13* 2.68* 2.61* 1.20* 0.73 0.65  CALCIUM 10.0 9.2  --  7.7* 8.0* 7.8*  MG  --   --   --   --  1.5* 1.7   Liver Function Tests: Recent Labs  Lab 03/30/23 0857 03/31/23 0552  AST 24 17  ALT 43 28  ALKPHOS 71 51  BILITOT 0.6 0.9  PROT 7.5 5.3*  ALBUMIN 4.0 2.4*   Recent Labs  Lab 03/30/23 0857  LIPASE 31   No results for input(s): "AMMONIA" in the last 168 hours. CBC: Recent Labs  Lab 03/30/23 0857 03/30/23 1311 03/31/23 0552 04/02/23 0557  WBC 5.5 3.6* 3.1* 4.1  NEUTROABS 2.9  --   --   --   HGB 14.9 12.6 12.0 12.0  HCT 44.4 38.5 38.0 36.5  MCV 93.3 95.1 98.7 95.3  PLT 217 143* 121* 126*   Cardiac Enzymes: No results for input(s): "CKTOTAL", "CKMB", "CKMBINDEX", "TROPONINI" in the last 168 hours. BNP: Invalid input(s): "POCBNP" CBG: No results for input(s): "GLUCAP" in the last 168 hours. D-Dimer No results for input(s): "DDIMER" in the last 72 hours. Hgb A1c No results for input(s): "HGBA1C" in the last 72 hours. Lipid Profile No results for input(s): "CHOL", "HDL", "LDLCALC", "TRIG", "CHOLHDL", "LDLDIRECT" in the last 72 hours. Thyroid function studies No results for input(s): "TSH", "T4TOTAL", "T3FREE", "THYROIDAB" in the last 72 hours.  Invalid input(s): "FREET3" Anemia work up No results for input(s): "VITAMINB12", "FOLATE", "FERRITIN", "TIBC", "IRON", "RETICCTPCT" in the last 72 hours. Urinalysis    Component Value Date/Time   COLORURINE YELLOW 03/31/2023 1419   APPEARANCEUR CLEAR 03/31/2023 1419   LABSPEC 1.015 03/31/2023 1419   PHURINE 5.0 03/31/2023 1419   GLUCOSEU NEGATIVE 03/31/2023 1419   HGBUR SMALL (A) 03/31/2023 1419   BILIRUBINUR NEGATIVE 03/31/2023 1419   KETONESUR 20 (A) 03/31/2023 1419   PROTEINUR NEGATIVE 03/31/2023 1419   NITRITE  NEGATIVE 03/31/2023 1419   LEUKOCYTESUR NEGATIVE 03/31/2023 1419   Sepsis Labs Recent Labs  Lab 03/30/23 0857 03/30/23 1311 03/31/23 0552 04/02/23 0557  WBC 5.5 3.6* 3.1* 4.1  Microbiology Recent Results (from the past 240 hour(s))  Gastrointestinal Panel by PCR , Stool     Status: None   Collection Time: 03/30/23 11:33 AM   Specimen: Stool  Result Value Ref Range Status   Campylobacter species NOT DETECTED NOT DETECTED Final   Plesimonas shigelloides NOT DETECTED NOT DETECTED Final   Salmonella species NOT DETECTED NOT DETECTED Final   Yersinia enterocolitica NOT DETECTED NOT DETECTED Final   Vibrio species NOT DETECTED NOT DETECTED Final   Vibrio cholerae NOT DETECTED NOT DETECTED Final   Enteroaggregative E coli (EAEC) NOT DETECTED NOT DETECTED Final   Enteropathogenic E coli (EPEC) NOT DETECTED NOT DETECTED Final   Enterotoxigenic E coli (ETEC) NOT DETECTED NOT DETECTED Final   Shiga like toxin producing E coli (STEC) NOT DETECTED NOT DETECTED Final   Shigella/Enteroinvasive E coli (EIEC) NOT DETECTED NOT DETECTED Final   Cryptosporidium NOT DETECTED NOT DETECTED Final   Cyclospora cayetanensis NOT DETECTED NOT DETECTED Final   Entamoeba histolytica NOT DETECTED NOT DETECTED Final   Giardia lamblia NOT DETECTED NOT DETECTED Final   Adenovirus F40/41 NOT DETECTED NOT DETECTED Final   Astrovirus NOT DETECTED NOT DETECTED Final   Norovirus GI/GII NOT DETECTED NOT DETECTED Final   Rotavirus A NOT DETECTED NOT DETECTED Final   Sapovirus (I, II, IV, and V) NOT DETECTED NOT DETECTED Final    Comment: Performed at [facility], [address] [place], [place] [address]  C Difficile Quick Screen w PCR reflex     Status: None   Collection Time: 03/30/23  3:36 PM   Specimen: STOOL  Result Value Ref Range Status   C Diff antigen NEGATIVE NEGATIVE Final   C Diff toxin NEGATIVE NEGATIVE Final   C Diff interpretation No C. difficile detected.  Final    Comment:  Performed at [facility], [address], [place], [place] [address]  Stool culture     Status: None (Preliminary result)   Collection Time: 03/30/23  3:36 PM   Specimen: STOOL  Result Value Ref Range Status   Salmonella/Shigella Screen Final report  Final    Comment: (NOTE) Performed At: [facility] [address] [place], [place] [id number] Jolene Schimke MD WG:[id number]    Campylobacter Culture PENDING  Incomplete   E coli, Shiga toxin Assay PENDING  Incomplete  STOOL CULTURE REFLEX - RSASHR     Status: None   Collection Time: 03/30/23  3:36 PM  Result Value Ref Range Status   Stool Culture result 1 (RSASHR) Comment  Final    Comment: (NOTE) No Salmonella or Shigella recovered. Performed At: [facility] [address] [place], [place] [id number] Jolene Schimke MD MW:[id number]     Please note: You were cared for by a hospitalist during your hospital stay. Once you are discharged, your primary care physician will handle any further medical issues. Please note that NO REFILLS for any discharge medications will be authorized once you are discharged, as it is imperative that you return to your primary care physician (or establish a relationship with a primary care physician if you do not have one) for your post hospital discharge needs so that they can reassess your need for medications and monitor your lab values.    Time coordinating discharge: 40 minutes  SIGNED:   Burnadette Pop, MD  [facility] 04/02/2023, 11:43 AM Pager [phone]  If 7PM-7AM, please contact night-coverage [email or web address] Password [email or web address]

## 2023-04-02 NOTE — Discharge Instructions (Signed)
Tips for Managing Diarrhea   Drink a lot of mild, clear liquids during the day. Room-temperature liquids may be better tolerated. To replace fluids lost with diarrhea, drink at least 1 cup of liquid after each loose bowel movement. Eat several small meals throughout the day. Lie down for 30 minutes after a meal to help slow digestion. Eat and drink foods that provide potassium and sodium, such as broths, soups, fruit juices, sport        drinks, crackers, pretzels, and ripe bananas. Eat foods with pectin (like applesauce or ripe bananas) to help firm stools. Prolonged diarrhea lasting more than 2 days may cause a temporary lactose intolerance. If this happens,        limit milk and dairy foods to no more than 2 cups per day. You can start eating dairy products again after        the diarrhea has subsided. Avoid foods, chewing gum, and candies made with sorbitol, xylitol, or mannitol. These types of sugars may cause diarrhea, gas, and bloating. Avoid greasy, fried, spicy, and sweet foods. Limit high-fiber foods like raw fruits and vegetables, whole grains, beans, nuts, and seeds as they are harder to digest. Once you are feeling better, slowly add foods with fiber back into your diet

## 2023-04-04 LAB — CALPROTECTIN, FECAL: Calprotectin, Fecal: 43 ug/g (ref 0–120)

## 2023-04-05 LAB — STOOL CULTURE

## 2023-04-06 ENCOUNTER — Telehealth: Payer: Self-pay

## 2023-04-06 DIAGNOSIS — I10 Essential (primary) hypertension: Secondary | ICD-10-CM | POA: Diagnosis not present

## 2023-04-06 DIAGNOSIS — R197 Diarrhea, unspecified: Secondary | ICD-10-CM | POA: Diagnosis not present

## 2023-04-06 DIAGNOSIS — N289 Disorder of kidney and ureter, unspecified: Secondary | ICD-10-CM | POA: Diagnosis not present

## 2023-04-06 NOTE — Telephone Encounter (Signed)
-----   Message from Doree Albee, [place] sent at 04/02/2023 11:06 AM EDT ----- Regarding: Please set up a follow up for this patient! Thanks! Patient of Dr. Leone Payor, Needs follow up in 2 weeks with Dr. Leone Payor or myself/Amy for hospital follow up/diarrhea.  Thanks, Marchelle Folks

## 2023-04-06 NOTE — Telephone Encounter (Signed)
Pt was made aware of recommendations:  Pt was scheduled for an office visit on 04/30/2023 at 1:30 PM with Amy Esterwood PA. Pt made aware.  Pt verbalized understanding with all questions answered.

## 2023-04-09 LAB — STOOL CULTURE REFLEX - CMPCXR

## 2023-04-10 LAB — PANCREATIC ELASTASE, FECAL: Pancreatic Elastase-1, Stool: <50 ug Elast./g — ABNORMAL LOW (ref >200)

## 2023-04-14 ENCOUNTER — Other Ambulatory Visit: Payer: Self-pay

## 2023-04-14 ENCOUNTER — Telehealth: Payer: Self-pay | Admitting: Internal Medicine

## 2023-04-14 MED ORDER — PANCRELIPASE (LIP-PROT-AMYL) 12000-38000 UNITS PO CPEP: 24000.0000 [IU] | ORAL_CAPSULE | Freq: Three times a day (TID) | ORAL | 6 refills | Status: DC

## 2023-04-14 NOTE — Telephone Encounter (Signed)
See alternate note  

## 2023-04-14 NOTE — Telephone Encounter (Signed)
Inbound call from patient, returning call in regards to Creon. Please advise.

## 2023-04-17 DIAGNOSIS — I10 Essential (primary) hypertension: Secondary | ICD-10-CM | POA: Diagnosis not present

## 2023-04-17 DIAGNOSIS — H43812 Vitreous degeneration, left eye: Secondary | ICD-10-CM | POA: Diagnosis not present

## 2023-04-17 DIAGNOSIS — H18593 Other hereditary corneal dystrophies, bilateral: Secondary | ICD-10-CM | POA: Diagnosis not present

## 2023-04-17 DIAGNOSIS — R197 Diarrhea, unspecified: Secondary | ICD-10-CM | POA: Diagnosis not present

## 2023-04-17 DIAGNOSIS — H04123 Dry eye syndrome of bilateral lacrimal glands: Secondary | ICD-10-CM | POA: Diagnosis not present

## 2023-04-17 DIAGNOSIS — H524 Presbyopia: Secondary | ICD-10-CM | POA: Diagnosis not present

## 2023-04-17 DIAGNOSIS — Z79899 Other long term (current) drug therapy: Secondary | ICD-10-CM | POA: Diagnosis not present

## 2023-04-17 DIAGNOSIS — Z961 Presence of intraocular lens: Secondary | ICD-10-CM | POA: Diagnosis not present

## 2023-04-24 DIAGNOSIS — I10 Essential (primary) hypertension: Secondary | ICD-10-CM | POA: Diagnosis not present

## 2023-04-24 DIAGNOSIS — R600 Localized edema: Secondary | ICD-10-CM | POA: Diagnosis not present

## 2023-04-24 DIAGNOSIS — R197 Diarrhea, unspecified: Secondary | ICD-10-CM | POA: Diagnosis not present

## 2023-04-30 ENCOUNTER — Ambulatory Visit: Payer: Medicare Other | Admitting: Physician Assistant

## 2023-04-30 DIAGNOSIS — R197 Diarrhea, unspecified: Secondary | ICD-10-CM | POA: Diagnosis not present

## 2023-04-30 DIAGNOSIS — I10 Essential (primary) hypertension: Secondary | ICD-10-CM | POA: Diagnosis not present

## 2023-04-30 DIAGNOSIS — R600 Localized edema: Secondary | ICD-10-CM | POA: Diagnosis not present

## 2023-05-21 DIAGNOSIS — M359 Systemic involvement of connective tissue, unspecified: Secondary | ICD-10-CM | POA: Diagnosis not present

## 2023-05-21 DIAGNOSIS — M2550 Pain in unspecified joint: Secondary | ICD-10-CM | POA: Diagnosis not present

## 2023-05-21 DIAGNOSIS — M1990 Unspecified osteoarthritis, unspecified site: Secondary | ICD-10-CM | POA: Diagnosis not present

## 2023-05-21 DIAGNOSIS — Z79899 Other long term (current) drug therapy: Secondary | ICD-10-CM | POA: Diagnosis not present

## 2023-05-25 DIAGNOSIS — M359 Systemic involvement of connective tissue, unspecified: Secondary | ICD-10-CM | POA: Diagnosis not present

## 2023-05-25 DIAGNOSIS — I10 Essential (primary) hypertension: Secondary | ICD-10-CM | POA: Diagnosis not present

## 2023-05-25 DIAGNOSIS — I7300 Raynaud's syndrome without gangrene: Secondary | ICD-10-CM | POA: Diagnosis not present

## 2023-06-03 DIAGNOSIS — I10 Essential (primary) hypertension: Secondary | ICD-10-CM | POA: Diagnosis not present

## 2023-06-09 ENCOUNTER — Ambulatory Visit (INDEPENDENT_AMBULATORY_CARE_PROVIDER_SITE_OTHER): Payer: Medicare Other | Admitting: Physician Assistant

## 2023-06-09 ENCOUNTER — Encounter: Payer: Self-pay | Admitting: Physician Assistant

## 2023-06-09 VITALS — BP 126/68 | HR 85 | Ht 61.0 in | Wt 118.0 lb

## 2023-06-09 DIAGNOSIS — R197 Diarrhea, unspecified: Secondary | ICD-10-CM | POA: Diagnosis not present

## 2023-06-09 NOTE — Progress Notes (Signed)
Chief Complaint: Follow up diarrhea  HPI:    Connie Gilbert is a  77 y/o female with a past medical history as listed below including IBS, who was referred to me by Merri Brunette, MD for a complaint of diarrhea.    04/2022 colonoscopy normal other than hemorrhoids.  Random biopsies were negative for microscopic colitis.     09/02/2022 patient seen in clinic by Dr. Leone Payor.  At that time noted she had a history of IBS with an increase in diarrhea problems.  She had tried Cholestyramine and Colestipol with variable success.  She had had a colonoscopy which was normal.  She was continued on Loperamide as needed.  Also briefly discussed elevated LFTs.    03/31/2023 patient seen in hospital for diarrhea by our service.  During that hospitalization C. difficile quick screen and GI path panel and culture were negative.  Also had a noncontrasted CT of the abdomen pelvis.  At that time a fecal calprotectin and elastase were ordered and started on Florastor 1 p.o. twice daily.  Fecal pancreatic elastase was less than 50.  She was started on Creon 2 tablets with every meal.     Today, patient presents to clinic and tells me that she has not had any diarrhea since the end of April.  She is still quite concerned that we never figured out the actual cause of her symptoms.  She does recall being on Creon with meals but she only took 1 month of this medicine and then stopped it because her prescription ran out.  Her Olmesartan was stopped and she feels like maybe this was the cause.  Regardless has no new GI complaints today.    Denies fever, chills, weight loss, blood in her stool, nausea or vomiting.  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      Calcium Carb-Cholecalciferol (CALCIUM + D3) 600-200 MG-UNIT TABS Take 600 mg by mouth daily.     hydrochlorothiazide (HYDRODIURIL) 25 MG tablet Take 25 mg by mouth daily.     hydroxychloroquine (PLAQUENIL) 200 MG tablet Take 200 mg by mouth daily.     nebivolol (BYSTOLIC) 5 MG tablet Take 5 mg by mouth daily.     psyllium (METAMUCIL) 58.6 % packet Take 1 packet by mouth daily.     Turmeric (QC TUMERIC COMPLEX PO) Take 400 mg by mouth daily.     amLODipine (NORVASC) 10 MG tablet Take 1 tablet (10 mg total) by mouth daily. 30 tablet 0   D 1000 25 MCG (1000 UT) capsule Take 1,000 Units by mouth daily.     lipase/protease/amylase (CREON) 12000-38000 units CPEP capsule Take 2 capsules (24,000 Units total) by mouth 3 (three) times daily before meals. 300 capsule 6   loperamide (IMODIUM) 2 MG capsule Take 1 capsule (2 mg total) by mouth every 8 (eight) hours as needed for diarrhea or loose stools. (Patient not taking: Reported on 06/09/2023) 30 capsule 0   ondansetron (ZOFRAN-ODT) 4 MG  disintegrating tablet Take 4 mg by mouth 3 (three) times daily as needed for nausea or vomiting.     potassium chloride SA (KLOR-CON M) 20 MEQ tablet Take 2 tablets (40 mEq total) by mouth daily for 3 days. 6 tablet 0   saccharomyces boulardii (FLORASTOR) 250 MG capsule Take 1 capsule (250 mg total) by mouth 2 (two) times daily. 60 capsule 0   No current facility-administered medications for this visit.    Allergies as of 06/09/2023 - Review Complete 06/09/2023  Allergen Reaction Noted   Alendronate sodium Other (See Comments) 10/28/2021   Iodinated contrast media Hives and Other (See Comments) 12/11/2014   Nitrofurantoin Other (See Comments) 10/28/2021    Family History  Problem Relation Age of Onset   Colon polyps Mother    Colon cancer Paternal Aunt    Esophageal  cancer Neg Hx    Rectal cancer Neg Hx    Stomach cancer Neg Hx    Breast cancer Neg Hx     Social History   Socioeconomic History   Marital status: Widowed    Spouse name: Not on file   Number of children: 1   Years of education: Not on file   Highest education level: Not on file  Occupational History   Not on file  Tobacco Use   Smoking status: Former   Smokeless tobacco: Never  Vaping Use   Vaping Use: Never used  Substance and Sexual Activity   Alcohol use: Yes    Alcohol/week: 0.0 standard drinks of alcohol    Comment: socially   Drug use: No   Sexual activity: Yes  Other Topics Concern   Not on file  Social History Narrative   Not on file   Social Determinants of Health   Financial Resource Strain: Not on file  Food Insecurity: No Food Insecurity (03/30/2023)   Hunger Vital Sign    Worried About Running Out of Food in the Last Year: Never true    Ran Out of Food in the Last Year: Never true  Transportation Needs: No Transportation Needs (03/30/2023)   PRAPARE - Administrator, Civil Service (Medical): No    Lack of Transportation (Non-Medical): No  Physical Activity: Not on file  Stress: Not on file  Social Connections: Not on file  Intimate Partner Violence: Not At Risk (03/30/2023)   Humiliation, Afraid, Rape, and Kick questionnaire    Fear of Current or Ex-Partner: No    Emotionally Abused: No    Physically Abused: No    Sexually Abused: No    Review of Systems:    Constitutional: No weight loss, fever or chills Cardiovascular: No chest pain Respiratory: No SOB  Gastrointestinal: See HPI and otherwise negative   Physical Exam:  Vital signs: BP 126/68   Pulse 85   Ht 5\' 1"  (1.549 m)   Wt 118 lb (53.5 kg)   BMI 22.30 kg/m   Constitutional:   Pleasant Caucasian female appears to be in NAD, Well developed, Well nourished, alert and cooperative Respiratory: Respirations even and unlabored. Lungs clear to auscultation bilaterally.   No  wheezes, crackles, or rhonchi.  Cardiovascular: Normal S1, S2. No MRG. Regular rate and rhythm. No peripheral edema, cyanosis or pallor.  Gastrointestinal:  Soft, nondistended, nontender. No rebound or guarding. Normal bowel sounds. No appreciable masses or hepatomegaly. Rectal:  Not performed.  Psychiatric: Oriented to person, place and time. Demonstrates good judgement and reason without abnormal affect or behaviors.  RELEVANT LABS AND IMAGING:  CBC    Component Value Date/Time   WBC 4.1 04/02/2023 0557   RBC 3.83 (L) 04/02/2023 0557   HGB 12.0 04/02/2023 0557   HCT 36.5 04/02/2023 0557   PLT 126 (L) 04/02/2023 0557   MCV 95.3 04/02/2023 0557   MCV 98.9 (A) 07/11/2013 1824   MCH 31.3 04/02/2023 0557   MCHC 32.9 04/02/2023 0557   RDW 14.4 04/02/2023 0557   LYMPHSABS 1.0 03/30/2023 0857   MONOABS 1.4 (H) 03/30/2023 0857   EOSABS 0.1 03/30/2023 0857   BASOSABS 0.1 03/30/2023 0857    CMP     Component Value Date/Time   NA 137 04/02/2023 0557   K 3.3 (L) 04/02/2023 0557   CL 115 (H) 04/02/2023 0557   CO2 14 (L) 04/02/2023 0557   GLUCOSE 88 04/02/2023 0557   BUN 16 04/02/2023 0557   CREATININE 0.65 04/02/2023 0557   CALCIUM 7.8 (L) 04/02/2023 0557   PROT 5.3 (L) 03/31/2023 0552   ALBUMIN 2.4 (L) 03/31/2023 0552   AST 17 03/31/2023 0552   ALT 28 03/31/2023 0552   ALKPHOS 51 03/31/2023 0552   BILITOT 0.9 03/31/2023 0552   GFRNONAA >60 04/02/2023 0557    Assessment: 1.  Diarrhea: Patient had diarrhea for about 2 months, unknown cause, was hospitalized during that time, fecal pancreatic elastase was low, started on Creon, symptoms did get better but she was also taken off of her Olmesartan at that time, symptoms gone now  Plan: 1.  Patient told to call us back if symptoms return.  Tried to reassure her.  Hyacinth Meeker, PA-C [facility] 06/09/2023, 1:28 PM  Cc: Merri Brunette, MD

## 2023-06-09 NOTE — Patient Instructions (Signed)
_______________________________________________________  If your blood pressure at your visit was 140/90 or greater, please contact your primary care physician to follow up on this.  _______________________________________________________  If you are age 76 or older, your body mass index should be between 23-30. Your Body mass index is 22.3 kg/m. If this is out of the aforementioned range listed, please consider follow up with your Primary Care Provider.  If you are age 93 or younger, your body mass index should be between 19-25. Your Body mass index is 22.3 kg/m. If this is out of the aformentioned range listed, please consider follow up with your Primary Care Provider.     ________________________________________________________  The  GI providers would like to encourage you to use [facility] to communicate with providers for non-urgent requests or questions.  Due to long hold times on the telephone, sending your provider a message by [facility] may be a faster and more efficient way to get a response.  Please allow 48 business hours for a response.  Please remember that this is for non-urgent requests.   It was a pleasure to see you today!  Thank you for trusting me with your gastrointestinal care!

## 2023-08-10 ENCOUNTER — Other Ambulatory Visit: Payer: Self-pay | Admitting: Medical Genetics

## 2023-08-10 DIAGNOSIS — Z006 Encounter for examination for normal comparison and control in clinical research program: Secondary | ICD-10-CM

## 2023-08-26 ENCOUNTER — Ambulatory Visit: Payer: Medicare Other | Attending: Internal Medicine | Admitting: Internal Medicine

## 2023-08-26 ENCOUNTER — Encounter: Payer: Self-pay | Admitting: Internal Medicine

## 2023-08-26 VITALS — BP 154/68 | HR 81 | Ht 61.0 in | Wt 124.0 lb

## 2023-08-26 DIAGNOSIS — E785 Hyperlipidemia, unspecified: Secondary | ICD-10-CM | POA: Diagnosis not present

## 2023-08-26 DIAGNOSIS — R9431 Abnormal electrocardiogram [ECG] [EKG]: Secondary | ICD-10-CM | POA: Insufficient documentation

## 2023-08-26 DIAGNOSIS — I10 Essential (primary) hypertension: Secondary | ICD-10-CM | POA: Insufficient documentation

## 2023-08-26 NOTE — Progress Notes (Signed)
Cardiology Office Note:  .   Date:  08/26/2023  ID:  Connie Gilbert, DOB July 30, 1946, MRN [id number] PCP: Merri Brunette, MD  [facility] Providers Cardiologist:  None    History of Present Illness: .   Connie Gilbert is a 77 y.o. female referred for management of uncontrolled hypertension by Merri Brunette, MD. 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  Negative RF factor, negative CCP, negative HLA-B27 per review of primary care notes.  For this she takes hydroxychloroquine 200 mg daily.  We contacted her pharmacy to confirm doses and medications as she was somewhat unclear.  She takes nebivolol 5 mg daily, hydrochlorothiazide 25 mg daily, hydralazine 25 mg 3 times daily, and valsartan 320 mg daily.  Also on atorvastatin 20 mg daily.  Feels her blood pressure is poorly controlled at baseline and has not been able to find factors medication therapy that controls.  She reports that her renal artery ultrasound has been performed and was normal, I do not see a renal artery ultrasound in the medical record, instead I see an ultrasound of the abdomen limited for the right upper quadrant which was completed in June.  She was seen by Corrie Dandy previous FNP in May at which time she was on hydrochlorothiazide, nebivolol, and her valsartan was increased.  She subsequently saw Dr. Renne Crigler at which point hydralazine was initiated.  She feels her BP still is elevated and that is demonstrated today as well. We reviewed all options for BP control, and participated in shared decision making.   She feels well otherwise with no chest pain or SOB. Active without issues.    ROS: per hpi  Studies Reviewed: Marland Kitchen   EKG Interpretation Date/Time:  Wednesday August 26 2023 16:08:46 EDT Ventricular Rate:  81 PR Interval:  174 QRS Duration:  62 QT Interval:  368 QTC  Calculation: 427 R Axis:   2  Text Interpretation: Normal sinus rhythm Septal infarct , age undetermined Confirmed by Connie Gilbert ([id number]) on 09/04/2023 3:22:49 PM     Risk Assessment/Calculations:          Physical Exam:   VS:  BP (!) 154/68 (BP Location: Right Arm, Patient Position: Sitting, Cuff Size: Normal)   Pulse 81   Ht 5\' 1"  (1.549 m)   Wt 124 lb (56.2 kg)   BMI 23.43 kg/m    Wt Readings from Last 3 Encounters:  08/26/23 124 lb (56.2 kg)  06/09/23 118 lb (53.5 kg)  03/30/23 126 lb 1.7 oz (57.2 kg)    GEN: Well nourished, well developed in no acute distress NECK: No JVD; No carotid bruits CARDIAC: RRR, no murmurs, rubs, gallops RESPIRATORY:  Clear to auscultation without rales, wheezing or rhonchi  ABDOMEN: Soft, non-tender, non-distended EXTREMITIES:  No edema; No deformity   ASSESSMENT AND PLAN: .    1. Primary hypertension   2. Hyperlipidemia, unspecified hyperlipidemia type   3. Abnormal EKG    We will check a BMET and then consider starting spironolactone.  Current on Valsartan 320 mg daily, nebivolol 5 mg daily, hydrochlorothiazide 25 mg daily and hydralazine 25 mg TID. Continue all for now. Consider changing nebivolol to carvedilol, consider changing hydrochlorothiazide to chlorthalidone.  Will check echo for HTN and abnormal EKG to continue workup of HTN.        Patient Instructions  Medication Instructions:  The current medical regimen is effective;  continue present plan  and medications as directed. Please refer to the Current Medication list given to you today.  *If you need a refill on your cardiac medications before your next appointment, please call your pharmacy*  Lab Work: BMET AS DISCUSSED-AFTER LAB WILL CONSIDER SPIRONOLACTONE If you have labs (blood work) drawn today and your tests are completely normal, you will receive your results only by:  MyChart Message (if you have MyChart) OR  A paper copy in the mail If you have any lab test that  is abnormal or we need to change your treatment, we will call you to review the results.  Testing/Procedures: Your physician has requested that you have an echocardiogram. Echocardiography is a painless test that uses sound waves to create images of your heart. It provides your doctor with information about the size and shape of your heart and how well your heart's chambers and valves are working. This procedure takes approximately one hour. There are no restrictions for this procedure. Please do NOT wear cologne, perfume, aftershave, or lotions (deodorant is allowed). Please arrive 15 minutes prior to your appointment time.   Follow-Up: At [facility], you and your health needs are our priority.  As part of our continuing mission to provide you with exceptional heart care, we have created designated Provider Care Teams.  These Care Teams include your primary Cardiologist (physician) and Advanced Practice Providers (APPs -  Physician Assistants and Nurse Practitioners) who all work together to provide you with the care you need, when you need it.  Your next appointment:   1 month(s)  Provider:   Marjie Skiff, PA-C  or ANY APP    Other Instructions   Total time of encounter: 45 minutes total time of encounter, including 30 minutes spent in face-to-face patient care on the date of this encounter. This time includes coordination of care and counseling regarding above mentioned problem list. Remainder of non-face-to-face time involved reviewing chart documents/testing relevant to the patient encounter and documentation in the medical record. I have independently reviewed documentation from referring provider.   Connie Brass, MD, [facility] [place]  [facility]    Signed, Parke Poisson, MD

## 2023-08-26 NOTE — Patient Instructions (Signed)
Medication Instructions:  The current medical regimen is effective;  continue present plan and medications as directed. Please refer to the Current Medication list given to you today.  *If you need a refill on your cardiac medications before your next appointment, please call your pharmacy*  Lab Work: BMET AS DISCUSSED-AFTER LAB WILL CONSIDER APIRONOLACTONE If you have labs (blood work) drawn today and your tests are completely normal, you will receive your results only by:  MyChart Message (if you have MyChart) OR  A paper copy in the mail If you have any lab test that is abnormal or we need to change your treatment, we will call you to review the results.  Testing/Procedures: Your physician has requested that you have an echocardiogram. Echocardiography is a painless test that uses sound waves to create images of your heart. It provides your doctor with information about the size and shape of your heart and how well your heart's chambers and valves are working. This procedure takes approximately one hour. There are no restrictions for this procedure. Please do NOT wear cologne, perfume, aftershave, or lotions (deodorant is allowed). Please arrive 15 minutes prior to your appointment time.   Follow-Up: At [facility], you and your health needs are our priority.  As part of our continuing mission to provide you with exceptional heart care, we have created designated Provider Care Teams.  These Care Teams include your primary Cardiologist (physician) and Advanced Practice Providers (APPs -  Physician Assistants and Nurse Practitioners) who all work together to provide you with the care you need, when you need it.  Your next appointment:   1 month(s)  Provider:   Marjie Skiff, PA-C  or ANY APP    Other Instructions

## 2023-08-27 ENCOUNTER — Encounter: Payer: Self-pay | Admitting: Internal Medicine

## 2023-08-27 DIAGNOSIS — E785 Hyperlipidemia, unspecified: Secondary | ICD-10-CM | POA: Diagnosis not present

## 2023-08-27 DIAGNOSIS — I10 Essential (primary) hypertension: Secondary | ICD-10-CM | POA: Diagnosis not present

## 2023-08-28 ENCOUNTER — Telehealth: Payer: Self-pay | Admitting: Internal Medicine

## 2023-08-28 DIAGNOSIS — I10 Essential (primary) hypertension: Secondary | ICD-10-CM

## 2023-08-28 LAB — BASIC METABOLIC PANEL
BUN/Creatinine Ratio: 21 (ref 12–28)
BUN: 19 mg/dL (ref 8–27)
CO2: 24 mmol/L (ref 20–29)
Calcium: 9.2 mg/dL (ref 8.7–10.3)
Chloride: 97 mmol/L (ref 96–106)
Creatinine, Ser: 0.91 mg/dL (ref 0.57–1.00)
Glucose: 94 mg/dL (ref 70–99)
Potassium: 4.5 mmol/L (ref 3.5–5.2)
Sodium: 136 mmol/L (ref 134–144)
eGFR: 65 mL/min/{1.73_m2} (ref >59)

## 2023-08-28 MED ORDER — SPIRONOLACTONE 25 MG PO TABS: 25.0000 mg | ORAL_TABLET | Freq: Every day | ORAL | 4 refills | Status: DC

## 2023-08-28 NOTE — Telephone Encounter (Signed)
Connie Poisson, MD  Bernita Buffy, RN Labs normal. If she would like to start spironolactone we can try it now and check labs again in 1 week after starting. Spironoactone 25 mg daily.  Discussed results and medication with pt. She is agreeable to addition of spironalactone 25mg  once daily. Prescription sent to pt's preferred pharmacy. Lab orders placed and mailed to pt's home address. Pt verbalizes understanding.

## 2023-08-28 NOTE — Telephone Encounter (Signed)
Patient is returning call to discuss lab results.

## 2023-09-14 NOTE — Progress Notes (Signed)
Cardiology Office Note:    Date:  09/25/2023   ID:  Connie Gilbert, DOB 10/14/1946, MRN [id number]  PCP:  Merri Brunette, MD  Cardiologist:  Parke Poisson, MD  Electrophysiologist:  None   Referring MD: Merri Brunette, MD   Chief Complaint: follow-up of hypertension  History of Present Illness:    Connie Gilbert is a 77 y.o. 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 Jacques Navy and presents today for follow-up of hypertension.   Patient was referred to Dr. Jacques Navy in 08/2023 for further evaluation of uncontrolled hypertension. She was initially diagnosed with hypertension at age 16. She reported that prior renal artery ultrasound was normal (although we did not have records of this). She was started on Spironolactone 25mg  daily. Echo was ordered and showed LVEF of 60-65% with normal wall motion and norma diastolic parameters, normal RV function, and no significant valvular disease.  She presents today for follow-up. BP is well controlled in the office today and she reports systolic BP is usually between 120 and 135 at home. She is doing well from a cardiac standpoint. No chest pain, shortness of breath, orthopnea, PND, edema. She reports occasional dizziness with quick positions changes but no palpitations or syncope. She describes Raynaud's and reports her finger tips will occasionally go white when her hands are cold. They actually did this when she first walked in the office but it had resolved by the time I got in the room. She also reports ongoing "issues" with her hands due to her undifferentiated connective tissue disease. She recently saw her Rheumatologist who recommended stopping Plaquenil for at least one month. They reportedly may try Methotrexate but patient is not sure is she want to try this.   EKGs/Labs/Other Studies Reviewed:    The following studies were  reviewed:  Echocardiogram 09/16/2023: Impressions: 1. Left ventricular ejection fraction, by estimation, is 60 to 65%. Left  ventricular ejection fraction by 2D MOD biplane is 61.3 %. The left  ventricle has normal function. The left ventricle has no regional wall  motion abnormalities. Left ventricular  diastolic parameters were grossly normal.   2. Right ventricular systolic function is normal. The right ventricular  size is normal. There is normal pulmonary artery systolic pressure. The  estimated right ventricular systolic pressure is 17.6 mmHg.   3. The mitral valve is normal in structure. Trivial mitral valve  regurgitation. No evidence of mitral stenosis.   4. The aortic valve is tricuspid. Aortic valve regurgitation is not  visualized. No aortic stenosis is present.   5. The inferior vena cava is normal in size with greater than 50%  respiratory variability, suggesting right atrial pressure of 3 mmHg.    EKG:  EKG not ordered today.   Recent Labs: 03/31/2023: ALT 28 04/02/2023: Hemoglobin 12.0; Magnesium 1.7; Platelets 126 08/27/2023: BUN 19; Creatinine, Ser 0.91; Potassium 4.5; Sodium 136  Recent Lipid Panel No results found for: "CHOL", "TRIG", "HDL", "CHOLHDL", "VLDL", "LDLCALC", "LDLDIRECT"  Physical Exam:    Vital Signs: BP 128/78 (BP Location: Left Arm, Patient Position: Sitting, Cuff Size: Normal)   Pulse (!) 55   Ht 5\' 1"  (1.549 m)   Wt 124 lb 6.4 oz (56.4 kg)   SpO2 (!) 83%   BMI 23.51 kg/m     Wt Readings from Last 3 Encounters:  09/25/23 124 lb 6.4 oz (56.4 kg)  08/26/23 124 lb (56.2 kg)  06/09/23  118 lb (53.5 kg)     General: 77 y.o. Caucaisn female in no acute distress. Neck: Supple. No JVD. Heart: RRR. No murmurs, gallops, or rubs.  Lungs: No increased work of breathing. Clear to ausculation bilaterally. No wheezes, rhonchi, or rales.  Extremities: Minimal lower extremity edema.    Skin: Warm and dry. Neuro: Alert and oriented x3. No focal  deficits. Psych: Normal affect. Responds appropriately.   Assessment:    1. Primary hypertension   2. Hyperlipidemia, unspecified hyperlipidemia type   3. Raynaud's disease without gangrene     Plan:    Hypertension BP well controlled. - Continue current medications: Valsartan 320mg  daily, Nebivolol 5mg  daily, HCTZ 25mg  daily, Spironolactone 25mg  daily, and Hydralazine 25mg  twice daily (initially prescribed as three times daily but has only been taking twice daily).  - Asked patient to continue to monitor BP at home and let us know if consistently >130/80.  Hyperlipidemia - Continue Lipitor 20mg  daily.  - Labs followed by PCP.  - She was recently enrolled in the [employer]-1 Prevent study.   Raynaud's Syndrome She describes Raynaud's syndrome.  - Discussed conservative measures like limiting cold exposure.  - Also discuss use of calcium channel blockers. She has previously note tolerated Amlodipine well due to edema and would like to continue current medications for now.   Disposition: Follow up in 6 months.    Medication Adjustments/Labs and Tests Ordered: Current medicines are reviewed at length with the patient today.  Concerns regarding medicines are outlined above.  No orders of the defined types were placed in this encounter.  Meds ordered this encounter  Medications   spironolactone (ALDACTONE) 25 MG tablet    Sig: Take 1 tablet (25 mg total) by mouth daily.    Dispense:  90 tablet    Refill:  3    Patient Instructions  Medication Instructions:  No Changes *If you need a refill on your cardiac medications before your next appointment, please call your pharmacy*   Lab Work: No Labs If you have labs (blood work) drawn today and your tests are completely normal, you will receive your results only by: MyChart Message (if you have MyChart) OR A paper copy in the mail If you have any lab test that is abnormal or we need to change your treatment, we will call you  to review the results.   Testing/Procedures: No Testing   Follow-Up: At [facility], you and your health needs are our priority.  As part of our continuing mission to provide you with exceptional heart care, we have created designated Provider Care Teams.  These Care Teams include your primary Cardiologist (physician) and Advanced Practice Providers (APPs -  Physician Assistants and Nurse Practitioners) who all work together to provide you with the care you need, when you need it.  We recommend signing up for the patient portal called "MyChart".  Sign up information is provided on this After Visit Summary.  MyChart is used to connect with patients for Virtual Visits (Telemedicine).  Patients are able to view lab/test results, encounter notes, upcoming appointments, etc.  Non-urgent messages can be sent to your provider as well.   To learn more about what you can do with MyChart, go to [email or web address].    Your next appointment:   6 month(s)  Provider:   Parke Poisson, MD  or Marjie Skiff, PA-C  Other Instructions Continue to monitor blood pressure at home if blood pressure is consistently above 130/80 please call out office  Signed, Corrin Parker, PA-C  09/25/2023 12:54 PM    [facility]

## 2023-09-16 ENCOUNTER — Ambulatory Visit (HOSPITAL_COMMUNITY): Payer: Medicare Other | Attending: Internal Medicine

## 2023-09-16 DIAGNOSIS — I10 Essential (primary) hypertension: Secondary | ICD-10-CM | POA: Insufficient documentation

## 2023-09-16 LAB — ECHOCARDIOGRAM COMPLETE
Area-P 1/2: 4.6 cm2
Calc EF: 61.3 %
S' Lateral: 2.3 cm
Single Plane A2C EF: 57.4 %
Single Plane A4C EF: 61.6 %

## 2023-09-16 MED ORDER — PERFLUTREN LIPID MICROSPHERE
1.0000 mL | INTRAVENOUS | Status: AC | PRN
Start: 2023-09-16 — End: 2023-09-16
  Administered 2023-09-16: 2 mL via INTRAVENOUS

## 2023-09-22 DIAGNOSIS — M359 Systemic involvement of connective tissue, unspecified: Secondary | ICD-10-CM | POA: Diagnosis not present

## 2023-09-22 DIAGNOSIS — M1990 Unspecified osteoarthritis, unspecified site: Secondary | ICD-10-CM | POA: Diagnosis not present

## 2023-09-22 DIAGNOSIS — M2550 Pain in unspecified joint: Secondary | ICD-10-CM | POA: Diagnosis not present

## 2023-09-22 DIAGNOSIS — Z79899 Other long term (current) drug therapy: Secondary | ICD-10-CM | POA: Diagnosis not present

## 2023-09-22 DIAGNOSIS — M2560 Stiffness of unspecified joint, not elsewhere classified: Secondary | ICD-10-CM | POA: Diagnosis not present

## 2023-09-25 ENCOUNTER — Ambulatory Visit: Payer: Medicare Other | Attending: Student | Admitting: Student

## 2023-09-25 ENCOUNTER — Encounter: Payer: Self-pay | Admitting: Student

## 2023-09-25 VITALS — BP 128/78 | HR 55 | Ht 61.0 in | Wt 124.4 lb

## 2023-09-25 DIAGNOSIS — I10 Essential (primary) hypertension: Secondary | ICD-10-CM | POA: Diagnosis not present

## 2023-09-25 DIAGNOSIS — I7300 Raynaud's syndrome without gangrene: Secondary | ICD-10-CM | POA: Diagnosis not present

## 2023-09-25 DIAGNOSIS — E785 Hyperlipidemia, unspecified: Secondary | ICD-10-CM | POA: Diagnosis not present

## 2023-09-25 MED ORDER — SPIRONOLACTONE 25 MG PO TABS: 25.0000 mg | ORAL_TABLET | Freq: Every day | ORAL | 3 refills | Status: DC

## 2023-09-25 NOTE — Patient Instructions (Signed)
Medication Instructions:  No Changes *If you need a refill on your cardiac medications before your next appointment, please call your pharmacy*   Lab Work: No Labs If you have labs (blood work) drawn today and your tests are completely normal, you will receive your results only by: MyChart Message (if you have MyChart) OR A paper copy in the mail If you have any lab test that is abnormal or we need to change your treatment, we will call you to review the results.   Testing/Procedures: No Testing   Follow-Up: At [facility], you and your health needs are our priority.  As part of our continuing mission to provide you with exceptional heart care, we have created designated Provider Care Teams.  These Care Teams include your primary Cardiologist (physician) and Advanced Practice Providers (APPs -  Physician Assistants and Nurse Practitioners) who all work together to provide you with the care you need, when you need it.  We recommend signing up for the patient portal called "MyChart".  Sign up information is provided on this After Visit Summary.  MyChart is used to connect with patients for Virtual Visits (Telemedicine).  Patients are able to view lab/test results, encounter notes, upcoming appointments, etc.  Non-urgent messages can be sent to your provider as well.   To learn more about what you can do with MyChart, go to [email or web address].    Your next appointment:   6 month(s)  Provider:   Parke Poisson, MD  or Marjie Skiff, PA-C  Other Instructions Continue to monitor blood pressure at home if blood pressure is consistently above 130/80 please call out office

## 2023-10-22 DIAGNOSIS — M2550 Pain in unspecified joint: Secondary | ICD-10-CM | POA: Diagnosis not present

## 2023-10-22 DIAGNOSIS — M359 Systemic involvement of connective tissue, unspecified: Secondary | ICD-10-CM | POA: Diagnosis not present

## 2023-10-22 DIAGNOSIS — Z79899 Other long term (current) drug therapy: Secondary | ICD-10-CM | POA: Diagnosis not present

## 2023-10-22 DIAGNOSIS — M1990 Unspecified osteoarthritis, unspecified site: Secondary | ICD-10-CM | POA: Diagnosis not present

## 2023-10-22 DIAGNOSIS — M2560 Stiffness of unspecified joint, not elsewhere classified: Secondary | ICD-10-CM | POA: Diagnosis not present

## 2023-10-22 DIAGNOSIS — I7300 Raynaud's syndrome without gangrene: Secondary | ICD-10-CM | POA: Diagnosis not present

## 2023-10-22 DIAGNOSIS — Z23 Encounter for immunization: Secondary | ICD-10-CM | POA: Diagnosis not present

## 2023-10-23 DIAGNOSIS — L309 Dermatitis, unspecified: Secondary | ICD-10-CM | POA: Diagnosis not present

## 2023-10-29 DIAGNOSIS — M810 Age-related osteoporosis without current pathological fracture: Secondary | ICD-10-CM | POA: Diagnosis not present

## 2023-10-29 DIAGNOSIS — I10 Essential (primary) hypertension: Secondary | ICD-10-CM | POA: Diagnosis not present

## 2023-11-03 DIAGNOSIS — I10 Essential (primary) hypertension: Secondary | ICD-10-CM | POA: Diagnosis not present

## 2023-11-03 DIAGNOSIS — M810 Age-related osteoporosis without current pathological fracture: Secondary | ICD-10-CM | POA: Diagnosis not present

## 2023-11-03 DIAGNOSIS — L739 Follicular disorder, unspecified: Secondary | ICD-10-CM | POA: Diagnosis not present

## 2023-11-03 DIAGNOSIS — I2510 Atherosclerotic heart disease of native coronary artery without angina pectoris: Secondary | ICD-10-CM | POA: Diagnosis not present

## 2023-11-03 DIAGNOSIS — E7800 Pure hypercholesterolemia, unspecified: Secondary | ICD-10-CM | POA: Diagnosis not present

## 2023-11-03 DIAGNOSIS — Z0000 Encounter for general adult medical examination without abnormal findings: Secondary | ICD-10-CM | POA: Diagnosis not present

## 2023-11-03 DIAGNOSIS — Z780 Asymptomatic menopausal state: Secondary | ICD-10-CM | POA: Diagnosis not present

## 2023-11-03 DIAGNOSIS — F411 Generalized anxiety disorder: Secondary | ICD-10-CM | POA: Diagnosis not present

## 2023-11-03 DIAGNOSIS — I700 Atherosclerosis of aorta: Secondary | ICD-10-CM | POA: Diagnosis not present

## 2023-11-03 DIAGNOSIS — M359 Systemic involvement of connective tissue, unspecified: Secondary | ICD-10-CM | POA: Diagnosis not present

## 2023-11-20 ENCOUNTER — Other Ambulatory Visit: Payer: Self-pay | Admitting: Internal Medicine

## 2023-11-20 DIAGNOSIS — Z1231 Encounter for screening mammogram for malignant neoplasm of breast: Secondary | ICD-10-CM

## 2023-11-23 DIAGNOSIS — D485 Neoplasm of uncertain behavior of skin: Secondary | ICD-10-CM | POA: Diagnosis not present

## 2023-11-23 DIAGNOSIS — L308 Other specified dermatitis: Secondary | ICD-10-CM | POA: Diagnosis not present

## 2023-11-23 DIAGNOSIS — L309 Dermatitis, unspecified: Secondary | ICD-10-CM | POA: Diagnosis not present

## 2023-11-30 DIAGNOSIS — I10 Essential (primary) hypertension: Secondary | ICD-10-CM | POA: Diagnosis not present

## 2023-11-30 DIAGNOSIS — L309 Dermatitis, unspecified: Secondary | ICD-10-CM | POA: Diagnosis not present

## 2023-12-22 ENCOUNTER — Ambulatory Visit
Admission: RE | Admit: 2023-12-22 | Discharge: 2023-12-22 | Disposition: A | Discharge status: Home / Self Care | Payer: Medicare Other | Source: Ambulatory Visit

## 2023-12-22 DIAGNOSIS — Z1231 Encounter for screening mammogram for malignant neoplasm of breast: Secondary | ICD-10-CM

## 2023-12-24 DIAGNOSIS — Z79899 Other long term (current) drug therapy: Secondary | ICD-10-CM | POA: Diagnosis not present

## 2023-12-24 DIAGNOSIS — M2550 Pain in unspecified joint: Secondary | ICD-10-CM | POA: Diagnosis not present

## 2023-12-24 DIAGNOSIS — M2560 Stiffness of unspecified joint, not elsewhere classified: Secondary | ICD-10-CM | POA: Diagnosis not present

## 2023-12-24 DIAGNOSIS — R21 Rash and other nonspecific skin eruption: Secondary | ICD-10-CM | POA: Diagnosis not present

## 2023-12-24 DIAGNOSIS — M359 Systemic involvement of connective tissue, unspecified: Secondary | ICD-10-CM | POA: Diagnosis not present

## 2023-12-24 DIAGNOSIS — M329 Systemic lupus erythematosus, unspecified: Secondary | ICD-10-CM | POA: Diagnosis not present

## 2023-12-24 DIAGNOSIS — M1990 Unspecified osteoarthritis, unspecified site: Secondary | ICD-10-CM | POA: Diagnosis not present

## 2023-12-24 DIAGNOSIS — I7300 Raynaud's syndrome without gangrene: Secondary | ICD-10-CM | POA: Diagnosis not present

## 2023-12-28 DIAGNOSIS — T50905A Adverse effect of unspecified drugs, medicaments and biological substances, initial encounter: Secondary | ICD-10-CM | POA: Diagnosis not present

## 2023-12-28 DIAGNOSIS — I10 Essential (primary) hypertension: Secondary | ICD-10-CM | POA: Diagnosis not present

## 2023-12-28 DIAGNOSIS — M810 Age-related osteoporosis without current pathological fracture: Secondary | ICD-10-CM | POA: Diagnosis not present

## 2023-12-28 DIAGNOSIS — L932 Other local lupus erythematosus: Secondary | ICD-10-CM | POA: Diagnosis not present

## 2023-12-29 DIAGNOSIS — I10 Essential (primary) hypertension: Secondary | ICD-10-CM | POA: Diagnosis not present

## 2024-01-18 DIAGNOSIS — I10 Essential (primary) hypertension: Secondary | ICD-10-CM | POA: Diagnosis not present

## 2024-01-18 DIAGNOSIS — L932 Other local lupus erythematosus: Secondary | ICD-10-CM | POA: Diagnosis not present

## 2024-02-18 DIAGNOSIS — R21 Rash and other nonspecific skin eruption: Secondary | ICD-10-CM | POA: Diagnosis not present

## 2024-02-18 DIAGNOSIS — Z79899 Other long term (current) drug therapy: Secondary | ICD-10-CM | POA: Diagnosis not present

## 2024-02-18 DIAGNOSIS — M359 Systemic involvement of connective tissue, unspecified: Secondary | ICD-10-CM | POA: Diagnosis not present

## 2024-02-18 DIAGNOSIS — I7300 Raynaud's syndrome without gangrene: Secondary | ICD-10-CM | POA: Diagnosis not present

## 2024-02-18 DIAGNOSIS — M1990 Unspecified osteoarthritis, unspecified site: Secondary | ICD-10-CM | POA: Diagnosis not present

## 2024-02-18 DIAGNOSIS — M329 Systemic lupus erythematosus, unspecified: Secondary | ICD-10-CM | POA: Diagnosis not present

## 2024-02-18 DIAGNOSIS — M2560 Stiffness of unspecified joint, not elsewhere classified: Secondary | ICD-10-CM | POA: Diagnosis not present

## 2024-02-18 DIAGNOSIS — M2550 Pain in unspecified joint: Secondary | ICD-10-CM | POA: Diagnosis not present

## 2024-02-23 DIAGNOSIS — L814 Other melanin hyperpigmentation: Secondary | ICD-10-CM | POA: Diagnosis not present

## 2024-02-23 DIAGNOSIS — L931 Subacute cutaneous lupus erythematosus: Secondary | ICD-10-CM | POA: Diagnosis not present

## 2024-02-23 DIAGNOSIS — L821 Other seborrheic keratosis: Secondary | ICD-10-CM | POA: Diagnosis not present

## 2024-02-23 DIAGNOSIS — D044 Carcinoma in situ of skin of scalp and neck: Secondary | ICD-10-CM | POA: Diagnosis not present

## 2024-03-20 DIAGNOSIS — H00021 Hordeolum internum right upper eyelid: Secondary | ICD-10-CM | POA: Diagnosis not present

## 2024-04-12 DIAGNOSIS — C4442 Squamous cell carcinoma of skin of scalp and neck: Secondary | ICD-10-CM | POA: Diagnosis not present

## 2024-04-22 DIAGNOSIS — H04123 Dry eye syndrome of bilateral lacrimal glands: Secondary | ICD-10-CM | POA: Diagnosis not present

## 2024-04-22 DIAGNOSIS — Z961 Presence of intraocular lens: Secondary | ICD-10-CM | POA: Diagnosis not present

## 2024-04-22 DIAGNOSIS — H18831 Recurrent erosion of cornea, right eye: Secondary | ICD-10-CM | POA: Diagnosis not present

## 2024-04-22 DIAGNOSIS — H43812 Vitreous degeneration, left eye: Secondary | ICD-10-CM | POA: Diagnosis not present

## 2024-04-22 DIAGNOSIS — Z79899 Other long term (current) drug therapy: Secondary | ICD-10-CM | POA: Diagnosis not present

## 2024-04-26 DIAGNOSIS — H18831 Recurrent erosion of cornea, right eye: Secondary | ICD-10-CM | POA: Diagnosis not present

## 2024-05-23 DIAGNOSIS — R21 Rash and other nonspecific skin eruption: Secondary | ICD-10-CM | POA: Diagnosis not present

## 2024-05-23 DIAGNOSIS — Z79899 Other long term (current) drug therapy: Secondary | ICD-10-CM | POA: Diagnosis not present

## 2024-05-23 DIAGNOSIS — M1990 Unspecified osteoarthritis, unspecified site: Secondary | ICD-10-CM | POA: Diagnosis not present

## 2024-05-23 DIAGNOSIS — I7300 Raynaud's syndrome without gangrene: Secondary | ICD-10-CM | POA: Diagnosis not present

## 2024-05-23 DIAGNOSIS — M329 Systemic lupus erythematosus, unspecified: Secondary | ICD-10-CM | POA: Diagnosis not present

## 2024-05-23 DIAGNOSIS — M2560 Stiffness of unspecified joint, not elsewhere classified: Secondary | ICD-10-CM | POA: Diagnosis not present

## 2024-05-23 DIAGNOSIS — M359 Systemic involvement of connective tissue, unspecified: Secondary | ICD-10-CM | POA: Diagnosis not present

## 2024-05-23 DIAGNOSIS — M2550 Pain in unspecified joint: Secondary | ICD-10-CM | POA: Diagnosis not present

## 2024-05-24 DIAGNOSIS — H18831 Recurrent erosion of cornea, right eye: Secondary | ICD-10-CM | POA: Diagnosis not present

## 2024-06-06 DIAGNOSIS — Z79899 Other long term (current) drug therapy: Secondary | ICD-10-CM | POA: Diagnosis not present

## 2024-06-06 DIAGNOSIS — M329 Systemic lupus erythematosus, unspecified: Secondary | ICD-10-CM | POA: Diagnosis not present

## 2024-09-06 DIAGNOSIS — M25532 Pain in left wrist: Secondary | ICD-10-CM | POA: Diagnosis not present

## 2024-09-06 DIAGNOSIS — S63522A Sprain of radiocarpal joint of left wrist, initial encounter: Secondary | ICD-10-CM | POA: Diagnosis not present

## 2024-09-06 DIAGNOSIS — S60212A Contusion of left wrist, initial encounter: Secondary | ICD-10-CM | POA: Diagnosis not present

## 2024-09-20 DIAGNOSIS — S63522A Sprain of radiocarpal joint of left wrist, initial encounter: Secondary | ICD-10-CM | POA: Diagnosis not present

## 2024-09-20 DIAGNOSIS — S60212A Contusion of left wrist, initial encounter: Secondary | ICD-10-CM | POA: Diagnosis not present

## 2024-09-22 DIAGNOSIS — M2560 Stiffness of unspecified joint, not elsewhere classified: Secondary | ICD-10-CM | POA: Diagnosis not present

## 2024-09-22 DIAGNOSIS — I7300 Raynaud's syndrome without gangrene: Secondary | ICD-10-CM | POA: Diagnosis not present

## 2024-09-22 DIAGNOSIS — L219 Seborrheic dermatitis, unspecified: Secondary | ICD-10-CM | POA: Diagnosis not present

## 2024-09-22 DIAGNOSIS — M1990 Unspecified osteoarthritis, unspecified site: Secondary | ICD-10-CM | POA: Diagnosis not present

## 2024-09-22 DIAGNOSIS — M2550 Pain in unspecified joint: Secondary | ICD-10-CM | POA: Diagnosis not present

## 2024-09-22 DIAGNOSIS — M329 Systemic lupus erythematosus, unspecified: Secondary | ICD-10-CM | POA: Diagnosis not present

## 2024-09-22 DIAGNOSIS — Z79899 Other long term (current) drug therapy: Secondary | ICD-10-CM | POA: Diagnosis not present

## 2024-09-22 DIAGNOSIS — Z23 Encounter for immunization: Secondary | ICD-10-CM | POA: Diagnosis not present

## 2024-09-30 ENCOUNTER — Other Ambulatory Visit: Payer: Self-pay | Admitting: Student

## 2024-10-18 DIAGNOSIS — S52552A Other extraarticular fracture of lower end of left radius, initial encounter for closed fracture: Secondary | ICD-10-CM | POA: Diagnosis not present

## 2024-10-18 DIAGNOSIS — S60212A Contusion of left wrist, initial encounter: Secondary | ICD-10-CM | POA: Diagnosis not present

## 2024-11-01 ENCOUNTER — Other Ambulatory Visit: Payer: Self-pay | Admitting: Internal Medicine

## 2024-11-02 DIAGNOSIS — I10 Essential (primary) hypertension: Secondary | ICD-10-CM | POA: Diagnosis not present

## 2024-11-02 DIAGNOSIS — M329 Systemic lupus erythematosus, unspecified: Secondary | ICD-10-CM | POA: Diagnosis not present

## 2024-11-02 DIAGNOSIS — I2584 Coronary atherosclerosis due to calcified coronary lesion: Secondary | ICD-10-CM | POA: Diagnosis not present

## 2024-11-02 DIAGNOSIS — M810 Age-related osteoporosis without current pathological fracture: Secondary | ICD-10-CM | POA: Diagnosis not present

## 2024-11-02 DIAGNOSIS — Z79899 Other long term (current) drug therapy: Secondary | ICD-10-CM | POA: Diagnosis not present

## 2024-11-02 MED ORDER — SPIRONOLACTONE 25 MG PO TABS: 25.0000 mg | ORAL_TABLET | Freq: Every day | ORAL | 0 refills | Status: AC

## 2024-11-07 DIAGNOSIS — H93A1 Pulsatile tinnitus, right ear: Secondary | ICD-10-CM | POA: Diagnosis not present

## 2024-11-07 DIAGNOSIS — Z0000 Encounter for general adult medical examination without abnormal findings: Secondary | ICD-10-CM | POA: Diagnosis not present

## 2024-11-07 DIAGNOSIS — M810 Age-related osteoporosis without current pathological fracture: Secondary | ICD-10-CM | POA: Diagnosis not present

## 2024-11-07 DIAGNOSIS — K589 Irritable bowel syndrome without diarrhea: Secondary | ICD-10-CM | POA: Diagnosis not present

## 2024-11-07 DIAGNOSIS — M1990 Unspecified osteoarthritis, unspecified site: Secondary | ICD-10-CM | POA: Diagnosis not present

## 2024-11-07 DIAGNOSIS — F411 Generalized anxiety disorder: Secondary | ICD-10-CM | POA: Diagnosis not present

## 2024-11-07 DIAGNOSIS — I2510 Atherosclerotic heart disease of native coronary artery without angina pectoris: Secondary | ICD-10-CM | POA: Diagnosis not present

## 2024-11-07 DIAGNOSIS — M329 Systemic lupus erythematosus, unspecified: Secondary | ICD-10-CM | POA: Diagnosis not present

## 2024-11-07 DIAGNOSIS — Z7982 Long term (current) use of aspirin: Secondary | ICD-10-CM | POA: Diagnosis not present

## 2024-11-07 DIAGNOSIS — E7800 Pure hypercholesterolemia, unspecified: Secondary | ICD-10-CM | POA: Diagnosis not present

## 2024-11-07 DIAGNOSIS — Z780 Asymptomatic menopausal state: Secondary | ICD-10-CM | POA: Diagnosis not present

## 2024-11-07 DIAGNOSIS — I10 Essential (primary) hypertension: Secondary | ICD-10-CM | POA: Diagnosis not present

## 2024-11-14 ENCOUNTER — Other Ambulatory Visit: Payer: Self-pay | Admitting: Internal Medicine
